# Patient Record
Sex: Male | Born: 1965 | Race: White | Hispanic: No | Marital: Married | Smoking: Never smoker
Health system: Southern US, Community
[De-identification: ages and names within clinical notes are randomized; demographics above are authoritative.]

## PROBLEM LIST (undated history)

## (undated) DIAGNOSIS — I10 Essential (primary) hypertension: Secondary | ICD-10-CM

## (undated) DIAGNOSIS — G709 Myoneural disorder, unspecified: Secondary | ICD-10-CM

## (undated) DIAGNOSIS — D751 Secondary polycythemia: Secondary | ICD-10-CM

## (undated) DIAGNOSIS — F32A Depression, unspecified: Secondary | ICD-10-CM

## (undated) DIAGNOSIS — F419 Anxiety disorder, unspecified: Secondary | ICD-10-CM

## (undated) DIAGNOSIS — R011 Cardiac murmur, unspecified: Secondary | ICD-10-CM

## (undated) DIAGNOSIS — J449 Chronic obstructive pulmonary disease, unspecified: Secondary | ICD-10-CM

## (undated) DIAGNOSIS — G4730 Sleep apnea, unspecified: Secondary | ICD-10-CM

## (undated) DIAGNOSIS — D759 Disease of blood and blood-forming organs, unspecified: Secondary | ICD-10-CM

## (undated) DIAGNOSIS — E669 Obesity, unspecified: Secondary | ICD-10-CM

## (undated) DIAGNOSIS — M1990 Unspecified osteoarthritis, unspecified site: Secondary | ICD-10-CM

## (undated) DIAGNOSIS — J45909 Unspecified asthma, uncomplicated: Secondary | ICD-10-CM

## (undated) DIAGNOSIS — R569 Unspecified convulsions: Secondary | ICD-10-CM

## (undated) HISTORY — DX: Essential (primary) hypertension: I10

## (undated) HISTORY — DX: Unspecified osteoarthritis, unspecified site: M19.90

## (undated) HISTORY — DX: Anxiety disorder, unspecified: F41.9

## (undated) HISTORY — DX: Cardiac murmur, unspecified: R01.1

## (undated) HISTORY — DX: Obesity, unspecified: E66.9

## (undated) HISTORY — DX: Chronic obstructive pulmonary disease, unspecified: J44.9

## (undated) HISTORY — PX: FRACTURE SURGERY: SHX138

## (undated) HISTORY — DX: Depression, unspecified: F32.A

## (undated) HISTORY — DX: Unspecified convulsions: R56.9

## (undated) HISTORY — DX: Unspecified asthma, uncomplicated: J45.909

---

## 1999-05-08 ENCOUNTER — Ambulatory Visit: Admission: RE | Admit: 1999-05-08 | Discharge: 1999-05-08 | Payer: Self-pay | Admitting: Otolaryngology

## 2002-04-10 ENCOUNTER — Encounter: Payer: Self-pay | Admitting: *Deleted

## 2002-04-10 ENCOUNTER — Emergency Department (HOSPITAL_COMMUNITY): Admission: EM | Admit: 2002-04-10 | Discharge: 2002-04-10 | Payer: Self-pay | Admitting: Emergency Medicine

## 2003-05-23 ENCOUNTER — Ambulatory Visit (HOSPITAL_COMMUNITY): Admission: RE | Admit: 2003-05-23 | Discharge: 2003-05-24 | Payer: Self-pay | Admitting: Otolaryngology

## 2004-10-20 ENCOUNTER — Emergency Department (HOSPITAL_COMMUNITY): Admission: EM | Admit: 2004-10-20 | Discharge: 2004-10-21 | Payer: Self-pay | Admitting: Emergency Medicine

## 2004-10-21 ENCOUNTER — Inpatient Hospital Stay (HOSPITAL_COMMUNITY): Admission: EM | Admit: 2004-10-21 | Discharge: 2004-10-23 | Payer: Self-pay | Admitting: Psychiatry

## 2004-10-21 ENCOUNTER — Ambulatory Visit: Payer: Self-pay | Admitting: Psychiatry

## 2007-09-18 ENCOUNTER — Emergency Department (HOSPITAL_COMMUNITY): Admission: EM | Admit: 2007-09-18 | Discharge: 2007-09-18 | Payer: Self-pay | Admitting: Family Medicine
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
## 2010-06-05 NOTE — Op Note (Signed)
NAME:  Brent Parks, Brent Parks                           ACCOUNT NO.:  [id number]   MEDICAL RECORD NO.:  [id number]                   PATIENT TYPE:  OIB   LOCATION:  3303                                 FACILITY:  [facility]   PHYSICIAN:  Suzanna Obey, M.D.                    DATE OF BIRTH:  Mar 17, 1965   DATE OF PROCEDURE:  05/23/2003  DATE OF DISCHARGE:                                 OPERATIVE REPORT   PREOPERATIVE DIAGNOSIS:  Deviated septum and turbinate hypertrophy.   POSTOPERATIVE DIAGNOSIS:  Deviated septum and turbinate hypertrophy.   OPERATION PERFORMED:  Septoplasty and submucous resection of inferior  turbinates.   SURGEON:  Suzanna Obey, M.D.   ANESTHESIA:  General endotracheal.   ESTIMATED BLOOD LOSS:  Less than 10 mL.   INDICATIONS FOR PROCEDURE:  This is a 45 year old who has had some chronic  nasal obstruction refractory to medical therapy.  He has obstructive sleep  apnea but just wants to acquire a better nasal airway to then treat the CPAP  later.  He was informed of the risks and benefits of the procedure including  bleeding, infection, perforation of the septum, change in the external  appearance of the nose, chronic crusting and drying, numbness of the teeth,  and risks of the anesthetic.  All questions were answered and consent was  obtained.   DESCRIPTION OF PROCEDURE:  The patient was taken to the operating room and  placed in supine position.  After adequate general endotracheal tube  anesthesia he was placed in supine position, prepped and draped in the usual  sterile manner.  Oxymetazoline pledgets were placed in the nose bilaterally  and then the septum and inferior turbinates were injected with 1% lidocaine  with 1:100,000 epinephrine.  A left hemitransfixion incision was performed  raising a mucoperichondrial and mucoperiosteal flap.  Cartilage was divided  about 1 cm posterior to the caudal strut and the cartilage was removed with  a Therapist, nutritional.  This was  deviated to the left.  The bone was then removed  with a Jansen-Middleton forceps also deviated to the left.  The inferior  spur was removed with a 4 mm osteotome.  This corrected the septal  deflection.  The turbinates were infractured.  A midline incision was made  with a 15 blade and mucosal flap elevated superiorly and inferior mucosa and  bone was removed with turbinate scissors.  Edge was cauterized with suction  cautery and the flap was laid back down over the raw surface and both  turbinates were outfractured.  The hemitransfixion incision was closed with  interrupted 4-0 chromic and a 4-0 plain gut quilting stitch placed through  the septum.  The nasopharynx was suctioned out of all blood and debris.  Telfa rolls soaked in bacitracin were placed into the nose bilaterally and  secured with 3-0 nylon.  The patient was awakened and brought to  the  recovery room in stable condition.  Counts correct.                                               Suzanna Obey, M.D.    Cordelia Pen  D:  05/23/2003  T:  05/23/2003  Job:  [id number]

## 2013-06-19 ENCOUNTER — Other Ambulatory Visit (HOSPITAL_COMMUNITY): Payer: Self-pay | Admitting: *Deleted

## 2013-06-20 ENCOUNTER — Ambulatory Visit (HOSPITAL_COMMUNITY)
Admission: RE | Admit: 2013-06-20 | Discharge: 2013-06-20 | Disposition: A | Discharge status: Home / Self Care | Payer: Medicare Other | Source: Ambulatory Visit | Attending: Family Medicine | Admitting: Family Medicine

## 2013-06-20 DIAGNOSIS — D582 Other hemoglobinopathies: Secondary | ICD-10-CM | POA: Insufficient documentation

## 2013-06-20 DIAGNOSIS — Z5181 Encounter for therapeutic drug level monitoring: Secondary | ICD-10-CM | POA: Diagnosis not present

## 2013-06-20 LAB — POCT HEMOGLOBIN-HEMACUE: Hemoglobin: 17.1 g/dL — ABNORMAL HIGH (ref 13.0–17.0)

## 2013-12-17 ENCOUNTER — Ambulatory Visit (INDEPENDENT_AMBULATORY_CARE_PROVIDER_SITE_OTHER): Payer: Medicare Other | Admitting: Infectious Disease

## 2013-12-17 ENCOUNTER — Ambulatory Visit (INDEPENDENT_AMBULATORY_CARE_PROVIDER_SITE_OTHER): Payer: Medicare Other | Admitting: Licensed Clinical Social Worker

## 2013-12-17 ENCOUNTER — Encounter: Payer: Self-pay | Admitting: Infectious Disease

## 2013-12-17 VITALS — BP 143/94 | HR 85 | Temp 98.4°F | Ht 68.0 in | Wt 209.2 lb

## 2013-12-17 DIAGNOSIS — B351 Tinea unguium: Secondary | ICD-10-CM

## 2013-12-17 DIAGNOSIS — E669 Obesity, unspecified: Secondary | ICD-10-CM | POA: Insufficient documentation

## 2013-12-17 DIAGNOSIS — F4329 Adjustment disorder with other symptoms: Secondary | ICD-10-CM

## 2013-12-17 DIAGNOSIS — Z23 Encounter for immunization: Secondary | ICD-10-CM

## 2013-12-17 DIAGNOSIS — B571 Acute Chagas' disease without heart involvement: Secondary | ICD-10-CM

## 2013-12-17 MED ORDER — TERBINAFINE HCL 250 MG PO TABS: 250.0000 mg | ORAL_TABLET | Freq: Every day | ORAL | Status: DC

## 2013-12-17 NOTE — Patient Instructions (Signed)
Your blood work for T. Cruzi (Chagas) from [facility] was a False Positive test , we do not recommend further testing based on your risk factors, labs, and your entire story. I have reviewed this with my colleague Dr. Luciana Axeomer

## 2013-12-17 NOTE — Progress Notes (Signed)
   Subjective:    Patient ID: Brent AbrahamsRandy S Parks, male    DOB: April 25, 1965, 48 y.o.   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 He brought in deceased insects which he believes that are "Reduvid bugs."  He never had symptoms consistent with acute infection of Chagas Romaa's sign or Chagoma. He is NOT from and endemic area for Chagas diseas having never travelled outside "of his zip code."  I reviewed case with my partner Dr. Luciana Axeomer and we agreed that the [facility] result was a FALSE POSITIVE and no need for further testing.      Review of Systems  Constitutional: Negative for fever, chills, diaphoresis, activity change, appetite change, fatigue and unexpected weight change.  HENT: Negative for congestion, rhinorrhea, sinus pressure, sneezing, sore throat and trouble swallowing.   Eyes: Negative for photophobia and visual disturbance.  Respiratory: Negative for cough, chest tightness, shortness of breath, wheezing and stridor.   Cardiovascular: Negative for chest pain, palpitations and leg swelling.  Gastrointestinal: Negative for nausea, vomiting, abdominal pain, diarrhea, constipation, blood in stool, abdominal distention and anal bleeding.  Genitourinary: Negative for dysuria, hematuria, flank pain and difficulty urinating.  Musculoskeletal: Negative for myalgias, back pain, joint swelling, arthralgias and gait problem.  Skin: Negative for color change, pallor, rash and wound.  Neurological: Negative for dizziness, tremors, weakness and light-headedness.  Hematological: Positive for adenopathy. Does not bruise/bleed easily.    Psychiatric/Behavioral: Positive for dysphoric mood. Negative for behavioral problems, confusion, sleep disturbance, decreased concentration and agitation.       Objective:   Physical Exam  Constitutional: He is oriented to person, place, and time. He appears well-developed and well-nourished. No distress.  HENT:  Head: Normocephalic and atraumatic.  Mouth/Throat: Oropharynx is clear and moist. No oropharyngeal exudate.  Eyes: Conjunctivae and EOM are normal. No scleral icterus.  Neck: Normal range of motion. Neck supple. No JVD present.  Cardiovascular: Normal rate and regular rhythm.   Pulmonary/Chest: Effort normal. No respiratory distress. He has no wheezes.  Abdominal: Soft. He exhibits no distension.  Musculoskeletal: He exhibits no edema or tenderness.  Lymphadenopathy:    He has no cervical adenopathy.  Neurological: He is alert and oriented to person, place, and time. He exhibits normal muscle tone. Coordination normal.  Skin: Skin is warm and dry. He is not diaphoretic. No erythema. No pallor.  Psychiatric: He has a normal mood and affect. His behavior is normal. Judgment and thought content normal.  Nursing note and vitals reviewed.  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 Luciana Axeomer) and he agreed this test from the [facility] is a false positive and no need for further testing. I spent greater than 45 minutes with the patient including greater than 50% of time in face to face counsel of the patient and in coordination of their care.   Onychomycosis: will write for 3 month supply of lamisil  systemic

## 2016-11-02 ENCOUNTER — Encounter: Payer: Self-pay | Admitting: Internal Medicine

## 2016-11-03 ENCOUNTER — Encounter: Payer: Self-pay | Admitting: Nurse Practitioner

## 2016-11-04 NOTE — H&P (Signed)
PREOPERATIVE H&P  Chief Complaint: Spontaneous rupture of extensor tendons, right lower leg, Pathological dislocation of right knee, not elsewhere classified [id number],  M24.361  HPI: Brent AbrahamsRandy S Parks is a 51 y.o. male who presents for preoperative history and physical with a diagnosis of Spontaneous rupture of extensor tendons, right lower leg, Pathological dislocation of right knee, not elsewhere classified M66.2361,  M24.361. Symptoms are rated as moderate to severe, and have been worsening.  This is significantly impairing activities of daily living.  He has elected for surgical management.   Past Medical History:  Diagnosis Date  . Hypertension   . Obesity    No past surgical history on file. Social History   Social History  . Marital status: Married    Spouse name: Parks/A  . Number of children: Parks/A  . Years of education: Parks/A   Social History Main Topics  . Smoking status: Never Smoker  . Smokeless tobacco: Not on file  . Alcohol use 3.6 oz/week    6 Cans of beer per week  . Drug use: No  . Sexual activity: Not on file   Other Topics Concern  . Not on file   Social History Narrative  . No narrative on file   No family history on file. No Known Allergies Prior to Admission medications   Medication Sig Start Date End Date Taking? Authorizing Provider  amphetamine-dextroamphetamine (ADDERALL XR) 20 MG 24 hr capsule Take 20 mg by mouth 2 (two) times daily.    [provider]  carbamazepine (TEGRETOL) 200 MG tablet Take 800 mg by mouth at bedtime.    [provider]  escitalopram (LEXAPRO) 20 MG tablet Take 20 mg by mouth 2 (two) times daily.    [provider]  lamoTRIgine (LAMICTAL) 200 MG tablet Take 200 mg by mouth 2 (two) times daily.    [provider]  lisinopril-hydrochlorothiazide (PRINZIDE,ZESTORETIC) 20-25 MG per tablet Take 1 tablet by mouth daily.    [provider]  terbinafine (LAMISIL) 250 MG tablet Take 1 tablet (250  mg total) by mouth daily. 12/17/13   Brent Parks, Brent N, MD     Positive ROS: All other systems have been reviewed and were otherwise negative with the exception of those mentioned in the HPI and as above.  Physical Exam: General: Alert, no acute distress Cardiovascular: No pedal edema Respiratory: No cyanosis, no use of accessory musculature GI: No organomegaly, abdomen is soft and non-tender Skin: No lesions in the area of chief complaint Neurologic: Sensation intact distally Psychiatric: Patient is competent for consent with normal mood and affect Lymphatic: No axillary or cervical lymphadenopathy  MUSCULOSKELETAL: R knee - weak extension, obvious asymmetry in patellar tendons, WWP foot, intact distal neuro status.  Assessment: Spontaneous rupture of extensor tendons, right lower leg, Pathological dislocation of right knee, not elsewhere classified [id number],  M24.361  Plan: Plan for Procedure(s): RIGHT KNEE PATELLA TENDON RECONSTRUCTION QUAD RELEASE TENDON TRANSFER ACHILLES ALLOGRAFT AVAILABLE  The risks benefits and alternatives were discussed with the patient including but not limited to the risks of nonoperative treatment, versus surgical intervention including infection, bleeding, nerve injury,  blood clots, cardiopulmonary complications, morbidity, mortality, among others, and they were willing to proceed.   Brent Pippinax T Rossie Bretado, MD  11/04/2016 2:12 PM

## 2016-11-08 ENCOUNTER — Telehealth: Payer: Self-pay | Admitting: Nurse Practitioner

## 2016-11-08 ENCOUNTER — Ambulatory Visit: Payer: Medicare Other | Admitting: Nurse Practitioner

## 2016-11-10 ENCOUNTER — Encounter (HOSPITAL_BASED_OUTPATIENT_CLINIC_OR_DEPARTMENT_OTHER): Payer: Self-pay | Admitting: *Deleted

## 2016-11-18 ENCOUNTER — Encounter: Payer: Medicare Other | Admitting: Internal Medicine

## 2017-01-05 ENCOUNTER — Encounter (HOSPITAL_BASED_OUTPATIENT_CLINIC_OR_DEPARTMENT_OTHER): Admission: RE | Payer: Self-pay | Source: Ambulatory Visit

## 2017-01-05 ENCOUNTER — Ambulatory Visit (HOSPITAL_BASED_OUTPATIENT_CLINIC_OR_DEPARTMENT_OTHER): Admission: RE | Admit: 2017-01-05 | Payer: Medicare Other | Source: Ambulatory Visit | Admitting: Orthopaedic Surgery

## 2017-01-05 HISTORY — DX: Disease of blood and blood-forming organs, unspecified: D75.9

## 2017-01-05 HISTORY — DX: Myoneural disorder, unspecified: G70.9

## 2017-01-05 HISTORY — DX: Sleep apnea, unspecified: G47.30

## 2017-01-05 HISTORY — DX: Secondary polycythemia: D75.1

## 2017-01-05 SURGERY — REPAIR, TENDON, PATELLAR
Anesthesia: Regional | Site: Knee | Laterality: Right

## 2019-11-19 HISTORY — PX: TENDON MANIPULATION: SHX394

## 2019-12-19 ENCOUNTER — Encounter (HOSPITAL_COMMUNITY): Payer: Self-pay | Admitting: *Deleted

## 2019-12-19 ENCOUNTER — Other Ambulatory Visit (HOSPITAL_COMMUNITY): Payer: Self-pay | Admitting: Emergency Medicine

## 2019-12-19 ENCOUNTER — Emergency Department (HOSPITAL_COMMUNITY)
Admission: EM | Admit: 2019-12-19 | Discharge: 2019-12-19 | Disposition: A | Discharge status: Home / Self Care | Payer: Medicare Other | Attending: Emergency Medicine | Admitting: Emergency Medicine

## 2019-12-19 ENCOUNTER — Other Ambulatory Visit: Payer: Self-pay

## 2019-12-19 ENCOUNTER — Emergency Department (HOSPITAL_COMMUNITY): Payer: Medicare Other

## 2019-12-19 DIAGNOSIS — J40 Bronchitis, not specified as acute or chronic: Secondary | ICD-10-CM | POA: Diagnosis not present

## 2019-12-19 DIAGNOSIS — R590 Localized enlarged lymph nodes: Secondary | ICD-10-CM | POA: Insufficient documentation

## 2019-12-19 DIAGNOSIS — I10 Essential (primary) hypertension: Secondary | ICD-10-CM | POA: Insufficient documentation

## 2019-12-19 DIAGNOSIS — Z7982 Long term (current) use of aspirin: Secondary | ICD-10-CM | POA: Diagnosis not present

## 2019-12-19 DIAGNOSIS — R0789 Other chest pain: Secondary | ICD-10-CM | POA: Insufficient documentation

## 2019-12-19 DIAGNOSIS — R059 Cough, unspecified: Secondary | ICD-10-CM | POA: Diagnosis present

## 2019-12-19 LAB — RESPIRATORY PANEL BY PCR

## 2019-12-19 LAB — CBC WITH DIFFERENTIAL/PLATELET
Abs Immature Granulocytes: 0.03 10*3/uL (ref 0.00–0.07)
Basophils Absolute: 0 10*3/uL (ref 0.0–0.1)
Basophils Relative: 0 %
Eosinophils Absolute: 0.3 10*3/uL (ref 0.0–0.5)
Eosinophils Relative: 3 %
HCT: 45 % (ref 39.0–52.0)
Hemoglobin: 15.6 g/dL (ref 13.0–17.0)
Immature Granulocytes: 0 %
Lymphocytes Relative: 25 %
Lymphs Abs: 2 10*3/uL (ref 0.7–4.0)
MCH: 31.7 pg (ref 26.0–34.0)
MCHC: 34.7 g/dL (ref 30.0–36.0)
MCV: 91.5 fL (ref 80.0–100.0)
Monocytes Absolute: 0.8 10*3/uL (ref 0.1–1.0)
Monocytes Relative: 10 %
Neutro Abs: 4.9 10*3/uL (ref 1.7–7.7)
Neutrophils Relative %: 62 %
Platelets: 287 10*3/uL (ref 150–400)
RBC: 4.92 MIL/uL (ref 4.22–5.81)
RDW: 13.7 % (ref 11.5–15.5)
WBC: 8 10*3/uL (ref 4.0–10.5)
nRBC: 0 % (ref 0.0–0.2)

## 2019-12-19 LAB — COMPREHENSIVE METABOLIC PANEL
ALT: 29 U/L (ref 0–44)
AST: 30 U/L (ref 15–41)
Albumin: 3.9 g/dL (ref 3.5–5.0)
Alkaline Phosphatase: 84 U/L (ref 38–126)
Anion gap: 11 (ref 5–15)
BUN: 11 mg/dL (ref 6–20)
CO2: 25 mmol/L (ref 22–32)
Calcium: 9 mg/dL (ref 8.9–10.3)
Chloride: 102 mmol/L (ref 98–111)
Creatinine, Ser: 1.11 mg/dL (ref 0.61–1.24)
GFR, Estimated: >60 mL/min (ref >60)
Glucose, Bld: 139 mg/dL — ABNORMAL HIGH (ref 70–99)
Potassium: 3.9 mmol/L (ref 3.5–5.1)
Sodium: 138 mmol/L (ref 135–145)
Total Bilirubin: 0.5 mg/dL (ref 0.3–1.2)
Total Protein: 7.5 g/dL (ref 6.5–8.1)

## 2019-12-19 MED ORDER — AEROCHAMBER Z-STAT PLUS/MEDIUM MISC: 1.0000 | Freq: Once | Status: DC

## 2019-12-19 MED ORDER — HYDROCODONE-HOMATROPINE 5-1.5 MG/5ML PO SYRP: 5.0000 mL | ORAL_SOLUTION | Freq: Four times a day (QID) | ORAL | 0 refills | Status: DC | PRN

## 2019-12-19 MED ORDER — ALBUTEROL SULFATE HFA 108 (90 BASE) MCG/ACT IN AERS
2.0000 | INHALATION_SPRAY | Freq: Once | RESPIRATORY_TRACT | Status: AC
  Administered 2019-12-19: 2 via RESPIRATORY_TRACT
  Filled 2019-12-19: qty 6.7

## 2019-12-19 MED ORDER — AEROCHAMBER PLUS FLO-VU MEDIUM MISC: 1.0000 | Freq: Once | Status: DC

## 2019-12-19 MED FILL — HYDROCODONE-HOMATROPINE SYR: 5-1.5 | 6 days supply | Qty: 120 | Fill #0

## 2019-12-19 NOTE — ED Provider Notes (Signed)
[facility]-EMERGENCY DEPT Provider Note   CSN: [id number] Arrival date & time: 12/19/19  0865     History Chief Complaint  Patient presents with  . Cough    Brent Parks is a 54 y.o. male w PMHx HTN, presenting with complaint of cough that began 3 days ago.  Patient states this has occurred multiple times in the past and never had definitive diagnosis.  He reports a severe coughing fits.  His cough is productive though he is not visualize the sputum.  He has soreness in his chest wall from the frequent coughing.  Coughing and shortness of breath or worse with laying flat, better with sitting upright.  He is not short of breath at rest, only associated with the coughing.  Denies any postnasal drip, nasal congestion, sore throat, body aches or other infectious symptoms, new LE edema.  Reports T-max of 14 F.  He was seen at urgent care yesterday who swabbed him for Covid which was negative.  He was prescribed prednisone and some other medication which he is currently unsure of.  He took a dose of the prednisone last night.  He states the urgent care provider thought his symptoms were attributed to allergies however PCP has referred him to [facility] pulmonology for further evaluation.  He is awaiting this appointment to be set up.  States this has occurred about 3 times in the past, this year occurred in January, July, and now.  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  Of note, he had patella tendon repair 3 weeks ago to the right knee, no complications from this.  The history is provided by the patient.       Past Medical History:  Diagnosis Date  . Blood dyscrasia    erythrocytosis  . Erythrocytosis   . Hypertension   . Neuromuscular disorder (HCC)   . Obesity   . Sleep apnea     Patient Active Problem List   Diagnosis Date Noted  . Obesity      Past Surgical History:  Procedure Laterality Date  . TENDON MANIPULATION Right 11/2019       No family history on file.  Social History   Tobacco Use  . Smoking status: Never Smoker  . Smokeless tobacco: Never Used  Vaping Use  . Vaping Use: Never assessed  Substance Use Topics  . Alcohol use: Not Currently  . Drug use: No    Home Medications Prior to Admission medications   Medication Sig Start Date End Date Taking? Authorizing Provider  amphetamine-dextroamphetamine (ADDERALL XR) 20 MG 24 hr capsule Take 20 mg by mouth daily.    Yes [provider]  aspirin 81 MG EC tablet Take 81 mg by mouth daily.   Yes [provider]  benzonatate (TESSALON) 100 MG capsule Take 100 mg by mouth 3 (three) times daily as needed for cough. 12/18/19  Yes [provider]  carbamazepine (TEGRETOL) 200 MG tablet Take 800 mg by mouth at bedtime.   Yes [provider]  escitalopram (LEXAPRO) 20 MG tablet Take 20 mg by mouth 2 (two) times daily.   Yes [provider]  lamoTRIgine (LAMICTAL) 200 MG tablet Take 400 mg by mouth daily.    Yes [provider]  Multiple Vitamin (MULTIVITAMIN WITH MINERALS) TABS tablet Take 1 tablet by mouth daily.   Yes [provider]  Omega-3 Fatty Acids (FISH  OIL PO) Take 1 tablet by mouth daily.   Yes [provider]  oxymetazoline (AFRIN) 0.05 % nasal spray Place 1 spray into both nostrils 2 (two) times daily as needed for congestion.   Yes [provider]  Phenyleph-Doxylamine-DM-APAP (NYQUIL SEVERE COLD/FLU) 5-6.25-10-325 MG/15ML LIQD Take 15 mLs by mouth as needed (congestion).   Yes [provider]  polyvinyl alcohol (LIQUIFILM TEARS) 1.4 % ophthalmic solution Place 1 drop into both eyes as needed for dry eyes.   Yes [provider]  predniSONE (DELTASONE) 20 MG tablet Take 40 mg by mouth daily. 5 day supply 12/18/19  Yes [provider]  traZODone (DESYREL)  50 MG tablet Take 50-150 mg by mouth at bedtime. 08/20/19  Yes [provider]  HYDROcodone-homatropine (HYCODAN) 5-1.5 MG/5ML syrup Take 5 mLs by mouth every 6 (six) hours as needed for cough. 12/19/19   Kinsley Holderman, [place] N, PA-C    Allergies    Patient has no known allergies.  Review of Systems   Review of Systems  Respiratory: Positive for cough and shortness of breath.   All other systems reviewed and are negative.   Physical Exam Updated Vital Signs BP (!) 149/101   Pulse 84   Temp 98 F (36.7 C) (Oral)   Resp 20   Ht 5\' 6"  (1.676 m)   Wt 92.5 kg   SpO2 95%   BMI 32.93 kg/m   Physical Exam Vitals and nursing note reviewed.  Constitutional:      General: He is not in acute distress.    Appearance: He is well-developed. He is not ill-appearing.  HENT:     Head: Normocephalic and atraumatic.     Mouth/Throat:     Mouth: Mucous membranes are moist.     Pharynx: Oropharynx is clear.  Eyes:     Conjunctiva/sclera: Conjunctivae normal.  Cardiovascular:     Rate and Rhythm: Normal rate and regular rhythm.  Pulmonary:     Effort: Pulmonary effort is normal. No respiratory distress.     Breath sounds: Normal breath sounds.  Abdominal:     General: Bowel sounds are normal.     Palpations: Abdomen is soft.     Tenderness: There is no abdominal tenderness.  Musculoskeletal:     Cervical back: Normal range of motion and neck supple.  Lymphadenopathy:     Cervical: Cervical adenopathy present.  Skin:    General: Skin is warm.  Neurological:     Mental Status: He is alert.  Psychiatric:        Behavior: Behavior normal.     ED Results / Procedures / Treatments   Labs (all labs ordered are listed, but only abnormal results are displayed) Labs Reviewed  COMPREHENSIVE METABOLIC PANEL - Abnormal; Notable for the following components:      Result Value   Glucose, Bld 139 (*)    All other components within normal limits  CBC WITH DIFFERENTIAL/PLATELET    EKG  None  Radiology DG Chest 2 View  Result Date: 12/19/2019 CLINICAL DATA:  Chronic recurring cough. EXAM: CHEST - 2 VIEW COMPARISON:  None. FINDINGS: The heart size and mediastinal contours are within normal limits. No focal consolidation. No visible pneumothorax. No pleural effusions. Thoracic spondylosis. Degenerative change bilateral AC joints. IMPRESSION: No active cardiopulmonary disease. Electronically Signed   By: 14/01/2019 MD   On: 12/19/2019 08:28    Procedures Procedures (including critical care time)  Medications Ordered in ED Medications  aerochamber Z-Stat Plus/medium 1 each (  1 each Other Refused 12/19/19 0920)  albuterol (VENTOLIN HFA) 108 (90 Base) MCG/ACT inhaler 2 puff (2 puffs Inhalation Given 12/19/19 0919)    ED Course  I have reviewed the triage vital signs and the nursing notes.  Pertinent labs & imaging results that were available during my care of the patient were reviewed by me and considered in my medical decision making (see chart for details).    MDM Rules/Calculators/A&P                          Patient presenting for cough x3 days.  States he has had this recurring cough multiple times per year and is unsure of the cause.  Temperature as high as 99 F at home, afebrile here as well.  He is only short of breath during active coughing fits and is having chest wall pain associated with the coughing.  No other ENT symptoms.  He is prescribed prednisone burst by urgent care yesterday as well as Tessalon Perles, with negative Covid swab.  No imaging was done.  PCP has referred him to pulmonology for further evaluation of this recurring cough, however was requesting some additional work-up today during his active symptoms.  On exam, he has some coughing fits with cough that sounds dry.  He is otherwise speaking full sentences with normal respiratory effort.  Lungs are clear bilaterally.  Chest x-ray is negative for infiltrate or other abnormal pulmonary findings.   Labs with normal white count and differential, no significant metabolic abnormalities.  Patient is given dose of albuterol to treat possible reactive airway.  Also ordered respiratory panel to evaluate for other viral causes.  Nighttime antitussive also prescribed due to difficulty managing cough with NyQuil.  He is instructed of strict return precautions.  Otherwise well-appearing and is in no acute distress, appropriate for discharge.  Discussed results, findings, treatment and follow up. Patient advised of return precautions. Patient verbalized understanding and agreed with plan.  Final Clinical Impression(s) / ED Diagnoses Final diagnoses:  Bronchitis    Rx / DC Orders ED Discharge Orders         Ordered    HYDROcodone-homatropine (HYCODAN) 5-1.5 MG/5ML syrup  Every 6 hours PRN        12/19/19 0913           Nohemi Nicklaus, [place] N, PA-C 12/19/19 1012    Lorre Nick, MD 12/24/19 1338

## 2019-12-19 NOTE — ED Triage Notes (Signed)
Pt reports cough x 3 days.  Pt reports that the coughing is so severe that it causes pain in chest.  Pt also reports a mild fever. Pt reports only taking Nyquil for symptoms.  Pt had a negative covid test yesterday and has been vaccinated with two shots.  Pt a/o x 4 and ambulating with crutches. Pt has been in contact with his PCP and is suppose to be set up with a pulmonologist from [facility].

## 2019-12-19 NOTE — Discharge Instructions (Signed)
You can take the Tessalon as directed as needed for cough during the day.  At nighttime you can take the cough syrup as directed. Do not take with the nyquil.  You can use 1 to 2 puffs of the inhaler every 6 hours as needed. Follow close with your primary care provider, and follow with pulmonology for your recurring cough. Return to the emergency department for persistent shortness of breath, or other concerning symptoms.

## 2020-07-24 ENCOUNTER — Other Ambulatory Visit (HOSPITAL_COMMUNITY): Payer: Self-pay

## 2020-07-24 ENCOUNTER — Emergency Department (HOSPITAL_COMMUNITY)
Admission: EM | Admit: 2020-07-24 | Discharge: 2020-07-24 | Disposition: A | Discharge status: Home / Self Care | Payer: Medicare Other | Attending: Emergency Medicine | Admitting: Emergency Medicine

## 2020-07-24 ENCOUNTER — Emergency Department (HOSPITAL_COMMUNITY): Payer: Medicare Other

## 2020-07-24 ENCOUNTER — Other Ambulatory Visit: Payer: Self-pay

## 2020-07-24 ENCOUNTER — Encounter (HOSPITAL_COMMUNITY): Payer: Self-pay

## 2020-07-24 DIAGNOSIS — W07XXXA Fall from chair, initial encounter: Secondary | ICD-10-CM | POA: Insufficient documentation

## 2020-07-24 DIAGNOSIS — Z7982 Long term (current) use of aspirin: Secondary | ICD-10-CM | POA: Insufficient documentation

## 2020-07-24 DIAGNOSIS — Y92002 Bathroom of unspecified non-institutional (private) residence single-family (private) house as the place of occurrence of the external cause: Secondary | ICD-10-CM | POA: Insufficient documentation

## 2020-07-24 DIAGNOSIS — S2231XA Fracture of one rib, right side, initial encounter for closed fracture: Secondary | ICD-10-CM | POA: Insufficient documentation

## 2020-07-24 DIAGNOSIS — Z79899 Other long term (current) drug therapy: Secondary | ICD-10-CM | POA: Diagnosis not present

## 2020-07-24 DIAGNOSIS — S299XXA Unspecified injury of thorax, initial encounter: Secondary | ICD-10-CM | POA: Diagnosis present

## 2020-07-24 DIAGNOSIS — W19XXXA Unspecified fall, initial encounter: Secondary | ICD-10-CM

## 2020-07-24 DIAGNOSIS — I10 Essential (primary) hypertension: Secondary | ICD-10-CM | POA: Diagnosis not present

## 2020-07-24 MED ORDER — OXYCODONE-ACETAMINOPHEN 5-325 MG PO TABS
1.0000 | ORAL_TABLET | Freq: Four times a day (QID) | ORAL | 0 refills | Status: DC | PRN
  Filled 2020-07-24: qty 20, 5d supply, fill #0

## 2020-07-24 MED ORDER — OXYCODONE-ACETAMINOPHEN 5-325 MG PO TABS
1.0000 | ORAL_TABLET | Freq: Once | ORAL | Status: AC
  Administered 2020-07-24: 1 via ORAL
  Filled 2020-07-24: qty 1

## 2020-07-24 NOTE — ED Notes (Signed)
Patient given incentive spirometer. Patient educated on use of IS.

## 2020-07-24 NOTE — ED Provider Notes (Signed)
Care assumed from [facility] at shift change, please see her note for full details, but in brief Brent Parks is a 55 y.o. male who presents after a fallLast night with sharp pain to the right rib cage.  Found to have 1/10 rib fracture, but lucency noted on humerus from rib films, follow-up dedicated x-ray recommended.  Plan: F/u humerus XR, anticipate discharge  BP 129/87 (BP Location: Left Arm)   Pulse 83   Temp 98.2 F (36.8 C) (Oral)   Resp 18   Ht $R'5\' 6"'ON$  (1.676 m)   Wt 86.2 kg   SpO2 95%   BMI 30.67 kg/m   ED Course/Procedures  Labs Reviewed - No data to display  Radiology DG Ribs Unilateral W/Chest Right  Result Date: 07/24/2020 CLINICAL DATA:  Right rib pain status post fall. EXAM: RIGHT RIBS AND CHEST - 3+ VIEW COMPARISON:  Chest x-ray 12/19/2019. FINDINGS: Slightly displaced right posterior tenth rib fractures noted. No pneumothorax. Incidental note is made of a lucency noted the proximal right humerus. Right humerus series suggested for further evaluation. Degenerative change thoracic spine. IMPRESSION: 1. Slightly displaced right posterior tenth rib fracture. No pneumothorax. 2. Incidental note is made of a of focal lucency in the proximal right humerus. Right humerus series suggested for further evaluation. Electronically Signed   By: Marcello Moores  Register   On: 07/24/2020 13:46   DG Humerus Right  Result Date: 07/24/2020 CLINICAL DATA:  Right humerus lesion seen on chest/rib radiograph. EXAM: RIGHT HUMERUS - 2+ VIEW COMPARISON:  Chest/right rib radiographs 07/24/2020. Right shoulder radiograph 07/19/2016. Right shoulder MRI 07/26/2016. FINDINGS: As seen on today's earlier rib series, there is a 1 cm well-defined lucent lesion in the proximal shaft of the right humerus with thin sclerotic margin. This is new from the 2018 radiographs and MRI. No other bone lesion is identified. There is no humerus fracture. The ribs were better evaluated on today's earlier dedicated radiographs.  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 The soft tissues are unremarkable. IMPRESSION: 1 cm lucent lesion in the proximal right humerus, new from 2018 and nonspecific. Consider evaluation for multiple myeloma. Electronically Signed   By: Logan Bores M.D.   On: 07/24/2020 15:23     Procedures  MDM   Humerus films with 1 cm lucent lesion in the proximal humerus new from 2018, could be from previous biceps injury, should follow-up with PCP, may need further evaluation for potential multiple myeloma.  Patient provided follow-up instructions and pain control.  Discharged home in good condition.       Jacqlyn Larsen, PA-C 07/24/20 1537    Isla Pence, MD 07/24/20 [phone]

## 2020-07-24 NOTE — ED Triage Notes (Signed)
Patient states he was standing in a chair in the bathroom and when he was stepping down he kicked the chair out and fell, landing on the bath tub with his right rib cage area yesterday. Patient denies hitting his head or having LOC.  Patient c/o right rib cage area pain.

## 2020-07-24 NOTE — Discharge Instructions (Addendum)
Your xray showed a fracture of your 10th rib. Please pick up pain medication and take as needed for pain. I would also recommend icing the area to help with pain and OTC Voltaren gel to the area.   The x-ray of your humerus showed a 1 cm lucent lesion, this may be from where you previously tore your bicep tendon, please follow-up with your primary care doctor for continued monitoring of this bony lesion.  Use the incentive spirometer as indicated.   Please follow up with your PCP regarding your ED visit today.   Return to the ED for any new/worsening symptoms including worsening shortness of breath, cough, fevers > 100.4, or any other new/concerning symptoms

## 2020-07-24 NOTE — ED Provider Notes (Signed)
[facility]-EMERGENCY DEPT Provider Note   CSN: [id number] Arrival date & time: 07/24/20  1224     History Chief Complaint  Patient presents with   Fall   rib cage pain    Brent Parks is a 55 y.o. male with Pmhx HTN who presents to the ED today with complaint of sudden onset, constant, sharp, right rib pain s/p fall that occurred last night.  Patient states that he was standing on a chair in the bathroom yesterday doing some work.  He states that he came down onto his left leg.  He states that he just had right knee surgery for patellar tendon rupture in February and states that he is limited range of motion to the right knee.  On his way down with his left leg his right knee locked up while on the chair causing him to fall.  Patient states that he attempted to grab onto the shower curtain however it fell causing him to fall directly onto the edge of the bathtub with his right side/rib area.  No head injury or loss of consciousness.  He states he has been having severe pain to this area since then however has not taken anything for pain.  He also complains of shortness of breath due to the pain.  He states he has broken ribs in the past however this feels much more severe.  No other complaints at this time.  Patient is not anticoagulated.  The history is provided by the patient and medical records.      Past Medical History:  Diagnosis Date   Blood dyscrasia    erythrocytosis   Erythrocytosis    Hypertension    Neuromuscular disorder (HCC)    Obesity    Sleep apnea     Patient Active Problem List   Diagnosis Date Noted   Obesity     Past Surgical History:  Procedure Laterality Date   TENDON MANIPULATION Right 11/2019       Family History  Problem Relation Age of Onset   COPD Mother     Social History   Tobacco Use   Smoking status: Never   Smokeless tobacco: Never  Vaping Use   Vaping Use: Never used  Substance Use Topics   Alcohol use:  Not Currently   Drug use: No    Home Medications Prior to Admission medications   Medication Sig Start Date End Date Taking? Authorizing Provider  oxyCODONE-acetaminophen (PERCOCET/ROXICET) 5-325 MG tablet Take 1 tablet by mouth every 6 (six) hours as needed for severe pain. 07/24/20  Yes Jeannifer Drakeford, PA-C  amphetamine-dextroamphetamine (ADDERALL XR) 20 MG 24 hr capsule Take 20 mg by mouth daily.     [provider]  aspirin 81 MG EC tablet Take 81 mg by mouth daily.    [provider]  benzonatate (TESSALON) 100 MG capsule Take 100 mg by mouth 3 (three) times daily as needed for cough. 12/18/19   [provider]  carbamazepine (TEGRETOL) 200 MG tablet Take 800 mg by mouth at bedtime.    [provider]  escitalopram (LEXAPRO) 20 MG tablet Take 20 mg by mouth 2 (two) times daily.    [provider]  lamoTRIgine (LAMICTAL) 200 MG tablet Take 400 mg by mouth daily.     [provider]  Multiple Vitamin (MULTIVITAMIN WITH MINERALS) TABS tablet Take 1 tablet by mouth daily.    [provider]  Omega-3 Fatty Acids (FISH OIL PO) Take 1 tablet  by mouth daily.    [provider]  oxymetazoline (AFRIN) 0.05 % nasal spray Place 1 spray into both nostrils 2 (two) times daily as needed for congestion.    [provider]  Phenyleph-Doxylamine-DM-APAP (NYQUIL SEVERE COLD/FLU) 5-6.25-10-325 MG/15ML LIQD Take 15 mLs by mouth as needed (congestion).    [provider]  polyvinyl alcohol (LIQUIFILM TEARS) 1.4 % ophthalmic solution Place 1 drop into both eyes as needed for dry eyes.    [provider]  predniSONE (DELTASONE) 20 MG tablet Take 40 mg by mouth daily. 5 day supply 12/18/19   [provider]  traZODone (DESYREL) 50 MG tablet Take 50-150 mg by mouth at bedtime. 08/20/19   [provider]    Allergies    Patient has no known allergies.  Review of Systems   Review of Systems   Constitutional:  Negative for chills and fever.  Respiratory:  Positive for shortness of breath.   Cardiovascular:  Positive for chest pain (right rib pain).  All other systems reviewed and are negative.  Physical Exam Updated Vital Signs BP 129/87 (BP Location: Left Arm)   Pulse 83   Temp 98.2 F (36.8 C) (Oral)   Resp 18   Ht 5\' 6"  (1.676 m)   Wt 86.2 kg   SpO2 95%   BMI 30.67 kg/m   Physical Exam Vitals and nursing note reviewed.  Constitutional:      Appearance: He is not ill-appearing.  HENT:     Head: Normocephalic and atraumatic.  Eyes:     Conjunctiva/sclera: Conjunctivae normal.  Cardiovascular:     Rate and Rhythm: Normal rate and regular rhythm.     Pulses: Normal pulses.  Pulmonary:     Effort: Pulmonary effort is normal.     Breath sounds: Normal breath sounds. No wheezing, rhonchi or rales.     Comments: No ecchymosis noted to right chest wall/back. + TTP to posterior lateral mid/lower ribs.  Pt speaking in short sentences due to pain. Shallow breathing. Satting 95% on RA. LCTAB.  Chest:     Chest wall: Tenderness present.  Abdominal:     Tenderness: There is no abdominal tenderness. There is no guarding or rebound.  Skin:    General: Skin is warm and dry.     Coloration: Skin is not jaundiced.  Neurological:     Mental Status: He is alert.    ED Results / Procedures / Treatments   Labs (all labs ordered are listed, but only abnormal results are displayed) Labs Reviewed - No data to display  EKG None  Radiology DG Ribs Unilateral W/Chest Right  Result Date: 07/24/2020 CLINICAL DATA:  Right rib pain status post fall. EXAM: RIGHT RIBS AND CHEST - 3+ VIEW COMPARISON:  Chest x-ray 12/19/2019. FINDINGS: Slightly displaced right posterior tenth rib fractures noted. No pneumothorax. Incidental note is made of a lucency noted the proximal right humerus. Right humerus series suggested for further evaluation. Degenerative change thoracic spine. IMPRESSION:  1. Slightly displaced right posterior tenth rib fracture. No pneumothorax. 2. Incidental note is made of a of focal lucency in the proximal right humerus. Right humerus series suggested for further evaluation. Electronically Signed   By: 14/01/2019  Register   On: 07/24/2020 13:46    Procedures Procedures   Medications Ordered in ED Medications  oxyCODONE-acetaminophen (PERCOCET/ROXICET) 5-325 MG per tablet 1 tablet (1 tablet Oral Given 07/24/20 1310)    ED Course  I have reviewed the triage vital signs and the nursing  notes.  Pertinent labs & imaging results that were available during my care of the patient were reviewed by me and considered in my medical decision making (see chart for details).    MDM Rules/Calculators/A&P                          55 year old male presents to the ED today after falling off of a chair and landing on his right ribs against the edge of the bathtub last night.  Has been having pain to this area with shortness of breath since.  No head injury or loss of consciousness.  On arrival to the ED vitals are stable.  Patient appears mildly uncomfortable on exam.  He is speaking in shorter sentences due to pain and shallow breaths.  He does have equal breath sounds throughout with equal chest rise.  There is no crepitus or ecchymosis noted to his chest wall.  He does have tenderness palpation to the lateral/posterior aspect of his ribs.  We will plan for x-ray and pain medication at this time with further evaluation.  Chest xray: IMPRESSION:  1. Slightly displaced right posterior tenth rib fracture. No  pneumothorax.     2. Incidental note is made of a of focal lucency in the proximal  right humerus. Right humerus series suggested for further  evaluation.   Pt does report hx of biceps rupture on right side; question if this is lucency they are experiencing. Pending xray of humerus.   At shift change case signed out to oncoming ED provider Brent Geralds, PA-C who will  dispo patient accordingly pending xray. Pt provided with incentive spirometer with instructions on same and discharged with pain meds and PCP follow up.   This note was prepared using Dragon voice recognition software and may include unintentional dictation errors due to the inherent limitations of voice recognition software.  Final Clinical Impression(s) / ED Diagnoses Final diagnoses:  Fall, initial encounter  Closed fracture of one rib of right side, initial encounter    Rx / DC Orders ED Discharge Orders          Ordered    oxyCODONE-acetaminophen (PERCOCET/ROXICET) 5-325 MG tablet  Every 6 hours PRN        07/24/20 1501             Tanda Rockers, PA-C 07/24/20 1522    Bethann Berkshire, MD 07/27/20 1654

## 2021-03-16 IMAGING — CR DG CHEST 2V
2 series · 2 of 2 positions shown · non-contrast
Comparison: None.

CLINICAL DATA: Chronic recurring cough.

EXAM:
CHEST - 2 VIEW

[w chest pa]
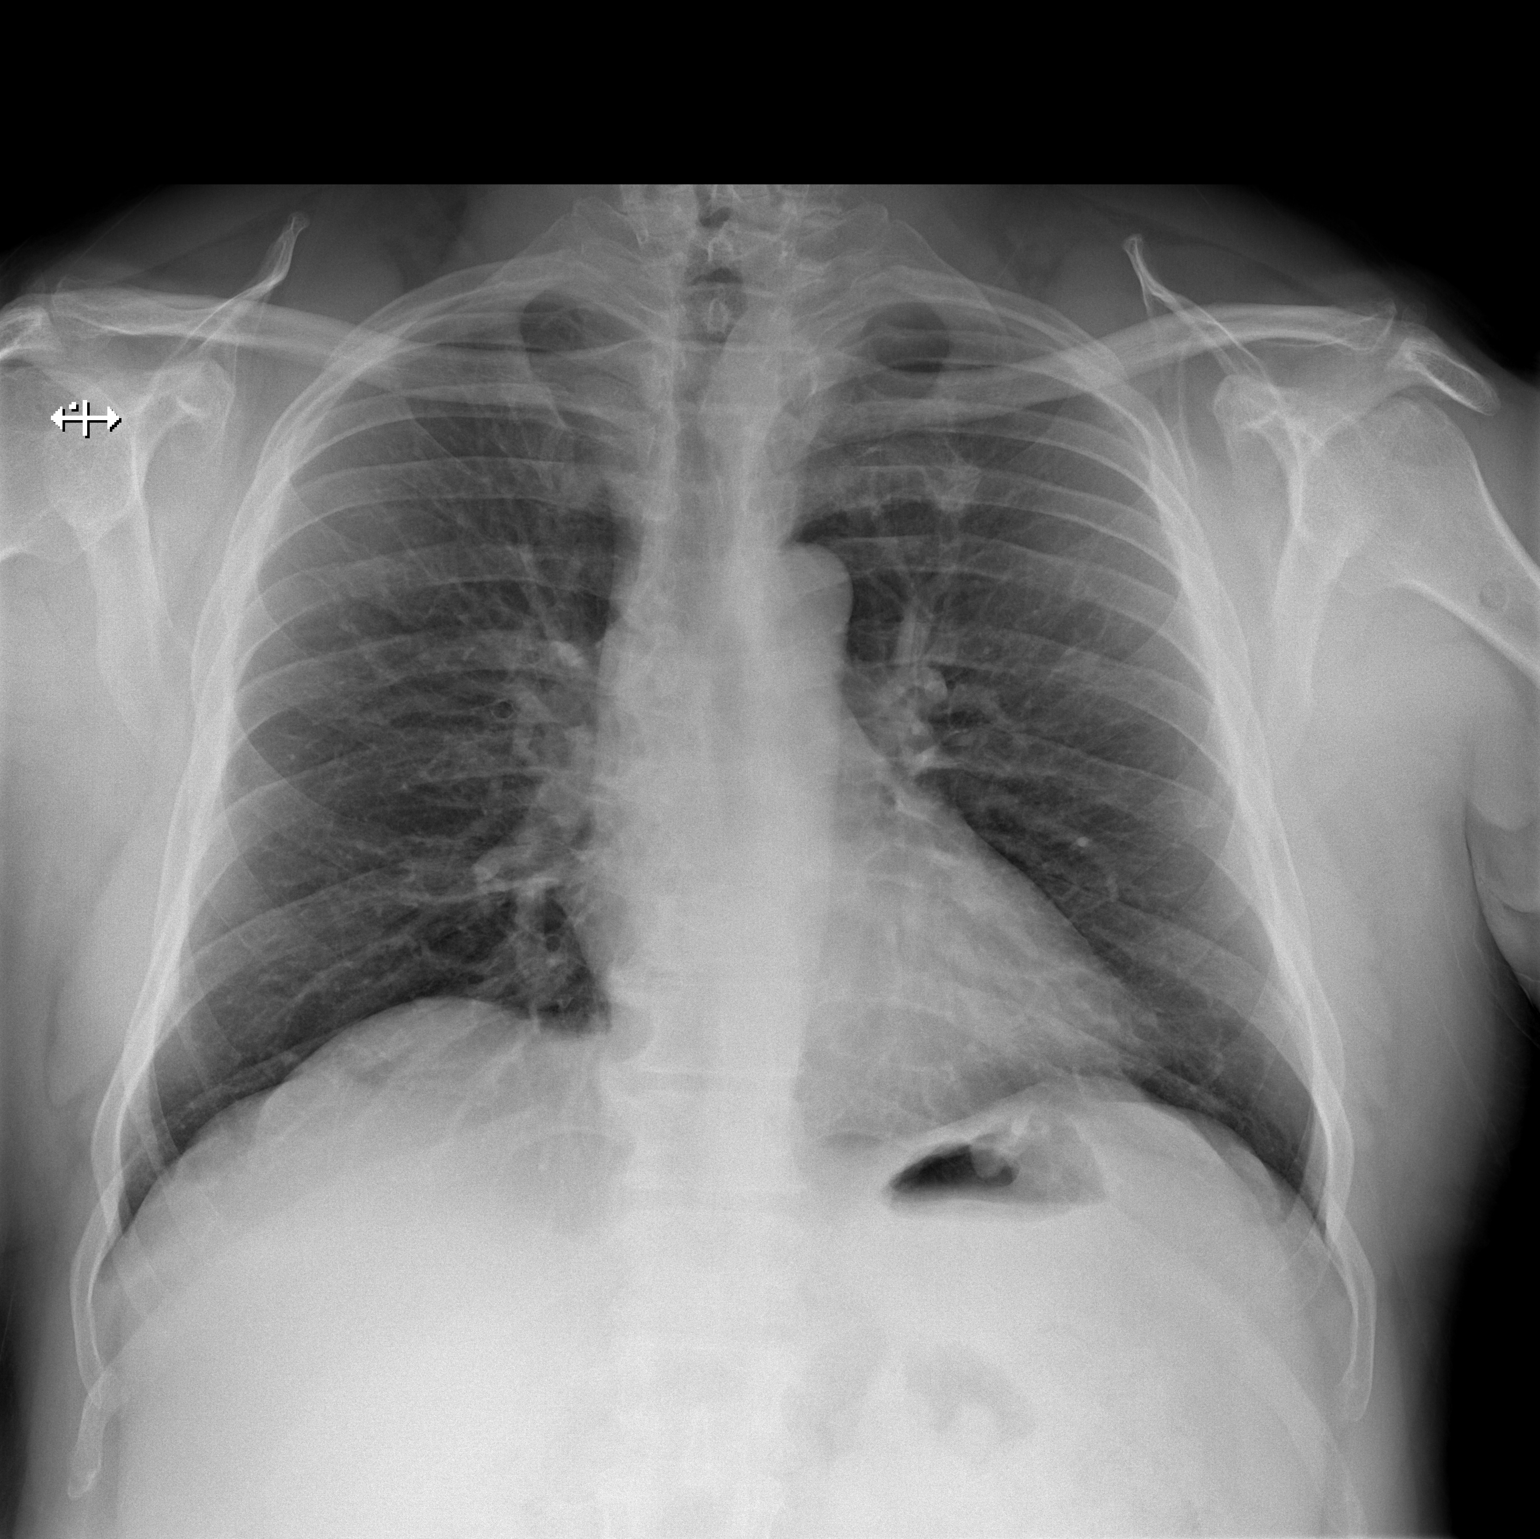

[w chest lat]
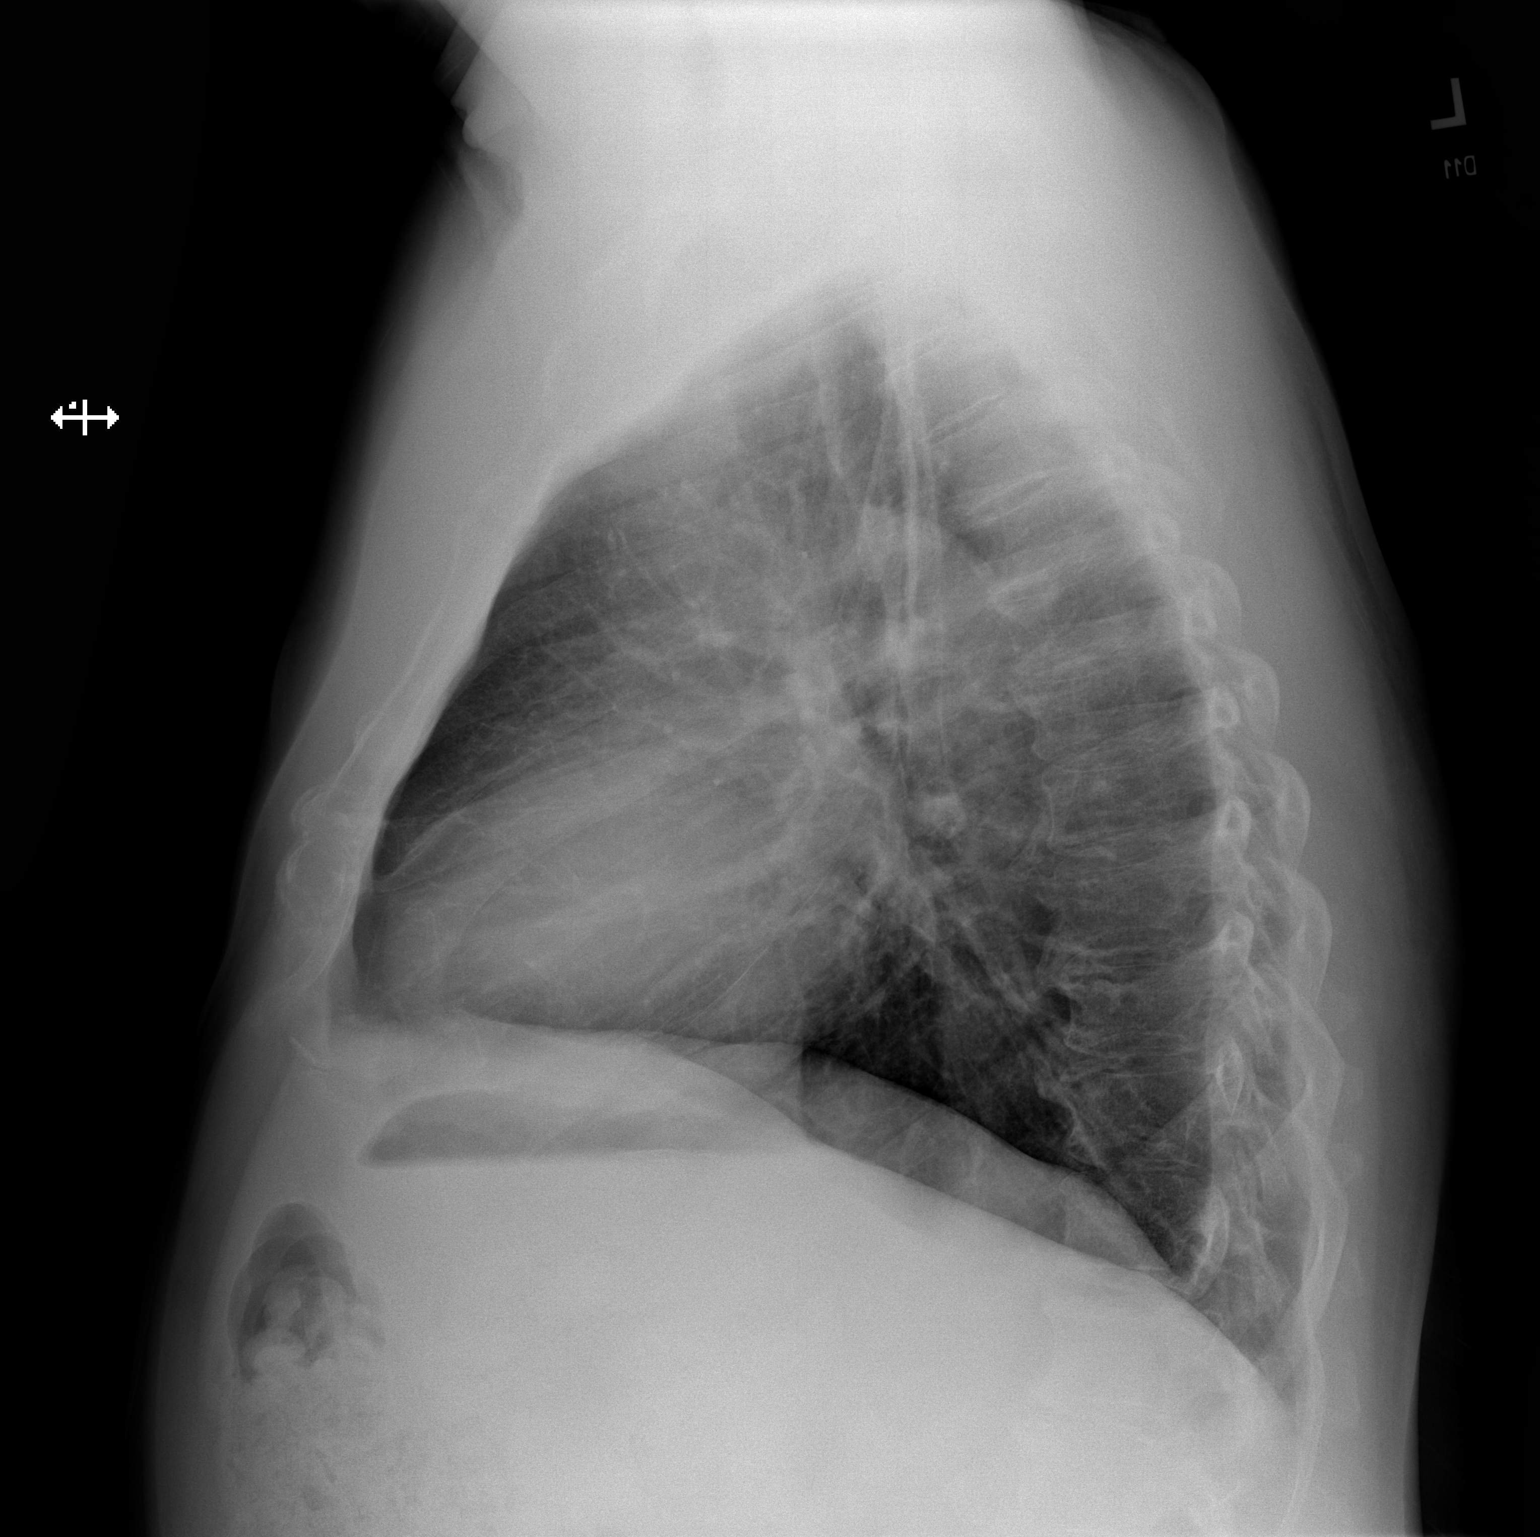

[2 of 2 positions shown; findings below may reference images not displayed]

FINDINGS: The heart size and mediastinal contours are within normal limits. No
focal consolidation. No visible pneumothorax. No pleural effusions.
Thoracic spondylosis. Degenerative change bilateral AC joints.
IMPRESSION: No active cardiopulmonary disease.

## 2021-07-13 ENCOUNTER — Other Ambulatory Visit: Payer: Self-pay

## 2021-07-13 ENCOUNTER — Encounter (HOSPITAL_COMMUNITY): Payer: Self-pay

## 2021-07-13 ENCOUNTER — Emergency Department (HOSPITAL_COMMUNITY)
Admission: EM | Admit: 2021-07-13 | Discharge: 2021-07-13 | Disposition: A | Discharge status: Home / Self Care | Payer: Medicare Other | Attending: Emergency Medicine | Admitting: Emergency Medicine

## 2021-07-13 DIAGNOSIS — R319 Hematuria, unspecified: Secondary | ICD-10-CM | POA: Diagnosis present

## 2021-07-13 DIAGNOSIS — D582 Other hemoglobinopathies: Secondary | ICD-10-CM | POA: Insufficient documentation

## 2021-07-13 DIAGNOSIS — Z7982 Long term (current) use of aspirin: Secondary | ICD-10-CM | POA: Diagnosis not present

## 2021-07-13 LAB — BASIC METABOLIC PANEL
Anion gap: 9 (ref 5–15)
BUN: 13 mg/dL (ref 6–20)
CO2: 27 mmol/L (ref 22–32)
Calcium: 9.7 mg/dL (ref 8.9–10.3)
Chloride: 103 mmol/L (ref 98–111)
Creatinine, Ser: 1.05 mg/dL (ref 0.61–1.24)
GFR, Estimated: >60 mL/min (ref >60)
Glucose, Bld: 118 mg/dL — ABNORMAL HIGH (ref 70–99)
Potassium: 4 mmol/L (ref 3.5–5.1)
Sodium: 139 mmol/L (ref 135–145)

## 2021-07-13 LAB — CBC WITH DIFFERENTIAL/PLATELET
Abs Immature Granulocytes: 0.02 10*3/uL (ref 0.00–0.07)
Basophils Absolute: 0 10*3/uL (ref 0.0–0.1)
Basophils Relative: 1 %
Eosinophils Absolute: 0.1 10*3/uL (ref 0.0–0.5)
Eosinophils Relative: 1 %
HCT: 50.9 % (ref 39.0–52.0)
Hemoglobin: 17.5 g/dL — ABNORMAL HIGH (ref 13.0–17.0)
Immature Granulocytes: 0 %
Lymphocytes Relative: 28 %
Lymphs Abs: 1.8 10*3/uL (ref 0.7–4.0)
MCH: 31.5 pg (ref 26.0–34.0)
MCHC: 34.4 g/dL (ref 30.0–36.0)
MCV: 91.5 fL (ref 80.0–100.0)
Monocytes Absolute: 0.6 10*3/uL (ref 0.1–1.0)
Monocytes Relative: 10 %
Neutro Abs: 3.9 10*3/uL (ref 1.7–7.7)
Neutrophils Relative %: 60 %
Platelets: 261 10*3/uL (ref 150–400)
RBC: 5.56 MIL/uL (ref 4.22–5.81)
RDW: 13.2 % (ref 11.5–15.5)
WBC: 6.5 10*3/uL (ref 4.0–10.5)
nRBC: 0 % (ref 0.0–0.2)

## 2021-07-13 LAB — URINALYSIS, ROUTINE W REFLEX MICROSCOPIC
Bilirubin Urine: NEGATIVE
Glucose, UA: NEGATIVE mg/dL
Ketones, ur: NEGATIVE mg/dL
Leukocytes,Ua: NEGATIVE
Nitrite: NEGATIVE
Protein, ur: NEGATIVE mg/dL
Specific Gravity, Urine: 1.015 (ref 1.005–1.030)
pH: 6 (ref 5.0–8.0)

## 2021-07-13 NOTE — ED Notes (Signed)
Pt states understanding of dc instructions, importance of follow up.  Pt denies questions or concerns upon dc. Pt declined wheelchair assistance upon dc. Pt ambulated out of ed w/ steady gait. No belongings left in room upon dc.  

## 2021-07-13 NOTE — ED Provider Notes (Signed)
[facility]-EMERGENCY DEPT Provider Note   CSN: [id number] Arrival date & time: 07/13/21  1002     History  Chief Complaint  Patient presents with   Hematuria    Brent Parks is a 56 y.o. male.  56 year old male with prior medical history as detailed below presents for evaluation.  Patient reports intermittent gross hematuria.  This is been a problem for the last 3 months.  Patient reports that the symptoms will come and go.  Patient reports seeing bright red blood in his urine and this morning.  He went to urgent care who referred him to the ED for evaluation.  Patient denies recent urologic work-up.  Patient denies feeling lightheaded or weak.  He denies fever.  He denies abdominal pain.  Patient does have a known thyroid nodule that he is scheduled for outpatient work-up and evaluation.  The history is provided by the patient and medical records.  Hematuria This is a recurrent problem. The current episode started more than 1 week ago. The problem occurs every several days. The problem has not changed since onset.Nothing aggravates the symptoms. Nothing relieves the symptoms.       Home Medications Prior to Admission medications   Medication Sig Start Date End Date Taking? Authorizing Provider  amphetamine-dextroamphetamine (ADDERALL XR) 20 MG 24 hr capsule Take 20 mg by mouth daily.     [provider]  aspirin 81 MG EC tablet Take 81 mg by mouth daily.    [provider]  benzonatate (TESSALON) 100 MG capsule Take 100 mg by mouth 3 (three) times daily as needed for cough. 12/18/19   [provider]  carbamazepine (TEGRETOL) 200 MG tablet Take 800 mg by mouth at bedtime.    [provider]  escitalopram (LEXAPRO) 20 MG tablet Take 20 mg by mouth 2 (two) times daily.    [provider]  lamoTRIgine (LAMICTAL) 200 MG tablet Take 400 mg by mouth daily.     [provider]  Multiple Vitamin (MULTIVITAMIN  WITH MINERALS) TABS tablet Take 1 tablet by mouth daily.    [provider]  Omega-3 Fatty Acids (FISH OIL PO) Take 1 tablet by mouth daily.    [provider]  oxyCODONE-acetaminophen (PERCOCET/ROXICET) 5-325 MG tablet Take 1 tablet by mouth every 6 (six) hours as needed for severe pain. 07/24/20   Tanda Rockers, PA-C  oxymetazoline (AFRIN) 0.05 % nasal spray Place 1 spray into both nostrils 2 (two) times daily as needed for congestion.    [provider]  Phenyleph-Doxylamine-DM-APAP (NYQUIL SEVERE COLD/FLU) 5-6.25-10-325 MG/15ML LIQD Take 15 mLs by mouth as needed (congestion).    [provider]  polyvinyl alcohol (LIQUIFILM TEARS) 1.4 % ophthalmic solution Place 1 drop into both eyes as needed for dry eyes.    [provider]  predniSONE (DELTASONE) 20 MG tablet Take 40 mg by mouth daily. 5 day supply 12/18/19   [provider]  traZODone (DESYREL) 50 MG tablet Take 50-150 mg by mouth at bedtime. 08/20/19   [provider]      Allergies    Patient has no known allergies.    Review of Systems   Review of Systems  Genitourinary:  Positive for hematuria.  All other systems reviewed and are negative.   Physical Exam Updated Vital Signs BP (!) 147/100   Pulse 96   Temp 98.2 F (36.8 C) (Oral)   Resp 18   Ht 5\' 6"  (1.676 m)   Wt  93 kg   SpO2 94%   BMI 33.09 kg/m  Physical Exam Vitals and nursing note reviewed.  Constitutional:      General: He is not in acute distress.    Appearance: Normal appearance. He is well-developed.  HENT:     Head: Normocephalic and atraumatic.  Eyes:     Conjunctiva/sclera: Conjunctivae normal.     Pupils: Pupils are equal, round, and reactive to light.  Cardiovascular:     Rate and Rhythm: Normal rate and regular rhythm.     Heart sounds: Normal heart sounds.  Pulmonary:     Effort: Pulmonary effort is normal. No respiratory distress.     Breath sounds: Normal breath sounds.   Abdominal:     General: There is no distension.     Palpations: Abdomen is soft.     Tenderness: There is no abdominal tenderness.  Musculoskeletal:        General: No deformity. Normal range of motion.     Cervical back: Normal range of motion and neck supple.  Skin:    General: Skin is warm and dry.  Neurological:     General: No focal deficit present.     Mental Status: He is alert and oriented to person, place, and time.    ED Results / Procedures / Treatments   Labs (all labs ordered are listed, but only abnormal results are displayed) Labs Reviewed  BASIC METABOLIC PANEL - Abnormal; Notable for the following components:      Result Value   Glucose, Bld 118 (*)    All other components within normal limits  CBC WITH DIFFERENTIAL/PLATELET - Abnormal; Notable for the following components:   Hemoglobin 17.5 (*)    All other components within normal limits  URINALYSIS, ROUTINE W REFLEX MICROSCOPIC    EKG None  Radiology No results found.  Procedures Procedures    Medications Ordered in ED Medications - No data to display  ED Course/ Medical Decision Making/ A&P                           Medical Decision Making Amount and/or Complexity of Data Reviewed Labs: ordered.    Medical Screen Complete  This patient presented to the ED with complaint of hematuria.  This complaint involves an extensive number of treatment options. The initial differential diagnosis includes, but is not limited to, infection, anemia, metabolic abnormality, etc.  This presentation is: Chronic, Self-Limited, Previously Undiagnosed, Uncertain Prognosis, Complicated, Systemic Symptoms, and Threat to Life/Bodily Function  Patient is presenting with complaint of chronic intermittent hematuria.  Patient is in no distress at time of evaluation.  Screening labs obtained.  Hemoglobin today is 17.5.  UA obtained is without gross hematuria.  Patient understands need for close outpatient  follow-up with urology.  Strict return precautions given and understood.    Additional history obtained:  External records from outside sources obtained and reviewed including prior ED visits and prior Inpatient records.    Lab Tests:  I ordered and personally interpreted labs.  The pertinent results include: CBC, BMP, UA  Problem List / ED Course:  Hematuria   Reevaluation:  After the interventions noted above, I reevaluated the patient and found that they have: improved  Disposition:  After consideration of the diagnostic results and the patients response to treatment, I feel that the patent would benefit from close outpatient follow-up.          Final Clinical Impression(s) / ED  Diagnoses Final diagnoses:  Hematuria, unspecified type    Rx / DC Orders ED Discharge Orders     None         Wynetta Fines, MD 07/13/21 1243

## 2021-08-11 ENCOUNTER — Other Ambulatory Visit (HOSPITAL_COMMUNITY): Payer: Self-pay

## 2021-08-11 MED ORDER — AMPHETAMINE-DEXTROAMPHET ER 30 MG PO CP24
ORAL_CAPSULE | ORAL | 0 refills | Status: DC
  Filled 2021-08-11: qty 30, 30d supply, fill #0

## 2021-08-11 MED ORDER — AMPHETAMINE-DEXTROAMPHET ER 30 MG PO CP24
30.0000 mg | ORAL_CAPSULE | Freq: Every morning | ORAL | 0 refills | Status: DC
  Filled 2021-08-11: qty 30, 30d supply, fill #0

## 2021-09-16 ENCOUNTER — Other Ambulatory Visit (HOSPITAL_COMMUNITY): Payer: Self-pay

## 2021-09-16 MED ORDER — CARBAMAZEPINE 200 MG PO TABS
ORAL_TABLET | ORAL | 0 refills | Status: DC
  Filled 2021-09-16: qty 120, 30d supply, fill #0

## 2021-09-16 MED ORDER — AMPHETAMINE-DEXTROAMPHET ER 30 MG PO CP24
30.0000 mg | ORAL_CAPSULE | Freq: Every morning | ORAL | 0 refills | Status: DC
  Filled 2021-09-16: qty 30, 30d supply, fill #0

## 2021-09-17 ENCOUNTER — Other Ambulatory Visit (HOSPITAL_COMMUNITY): Payer: Self-pay

## 2021-10-20 IMAGING — DX DG HUMERUS 2V *R*
3 series · 3 of 3 positions shown · non-contrast
Comparison: Chest/right rib radiographs 07/24/2020. Right shoulder
radiograph 07/19/2016. Right shoulder MRI 07/26/2016.

CLINICAL DATA: Right humerus lesion seen on chest/rib radiograph.

EXAM:
RIGHT HUMERUS - 2+ VIEW

[humerus ap (1 of 2)]
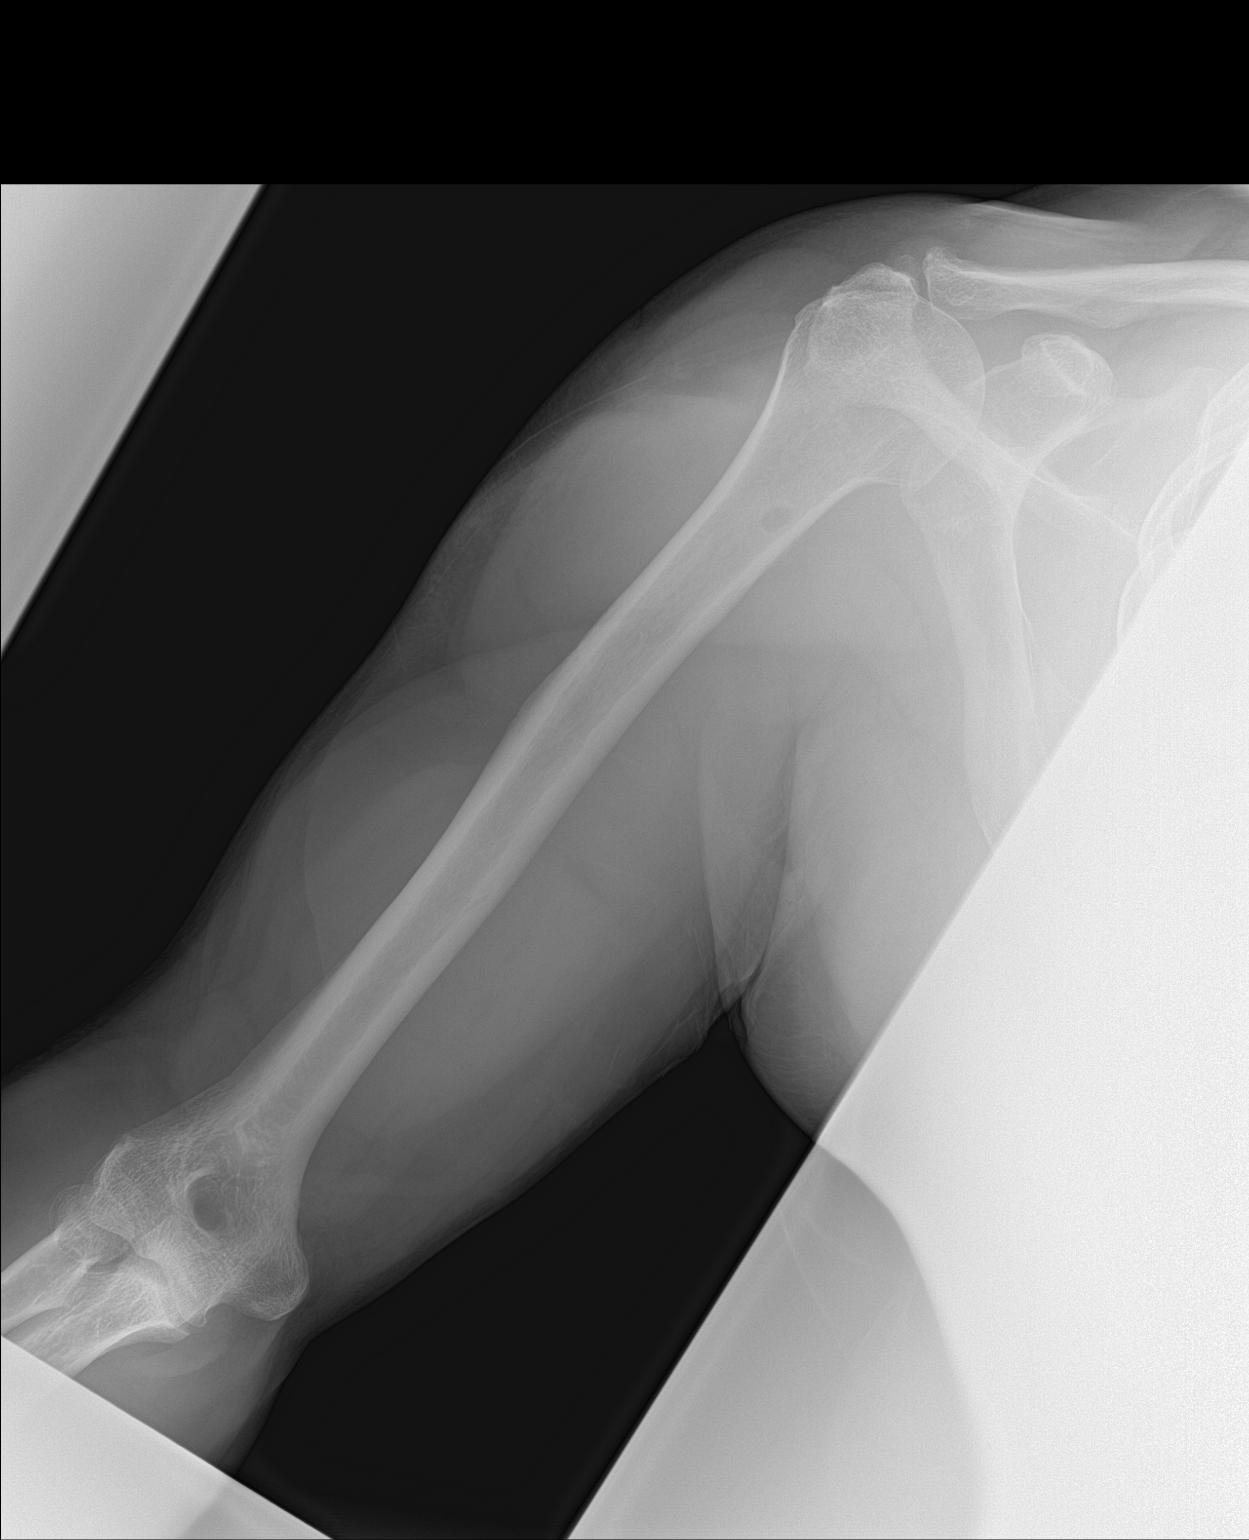

[humerus lat]
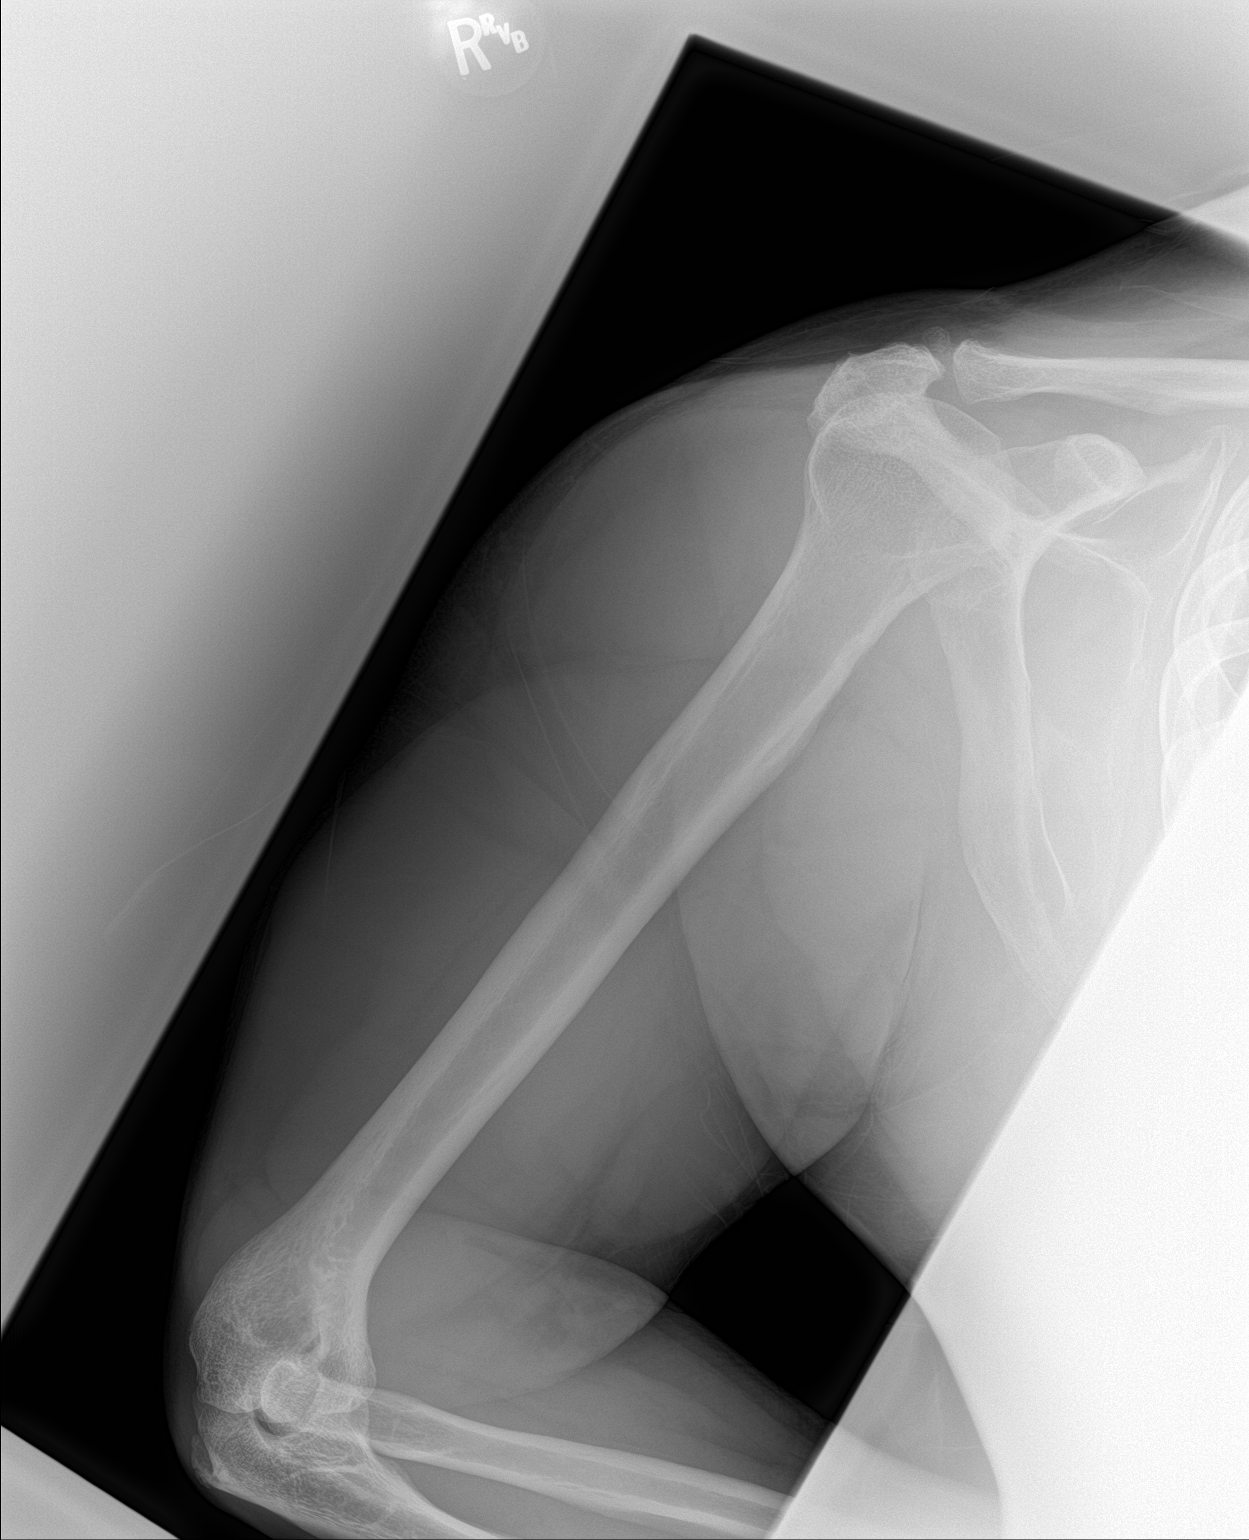

[humerus ap (2 of 2)]
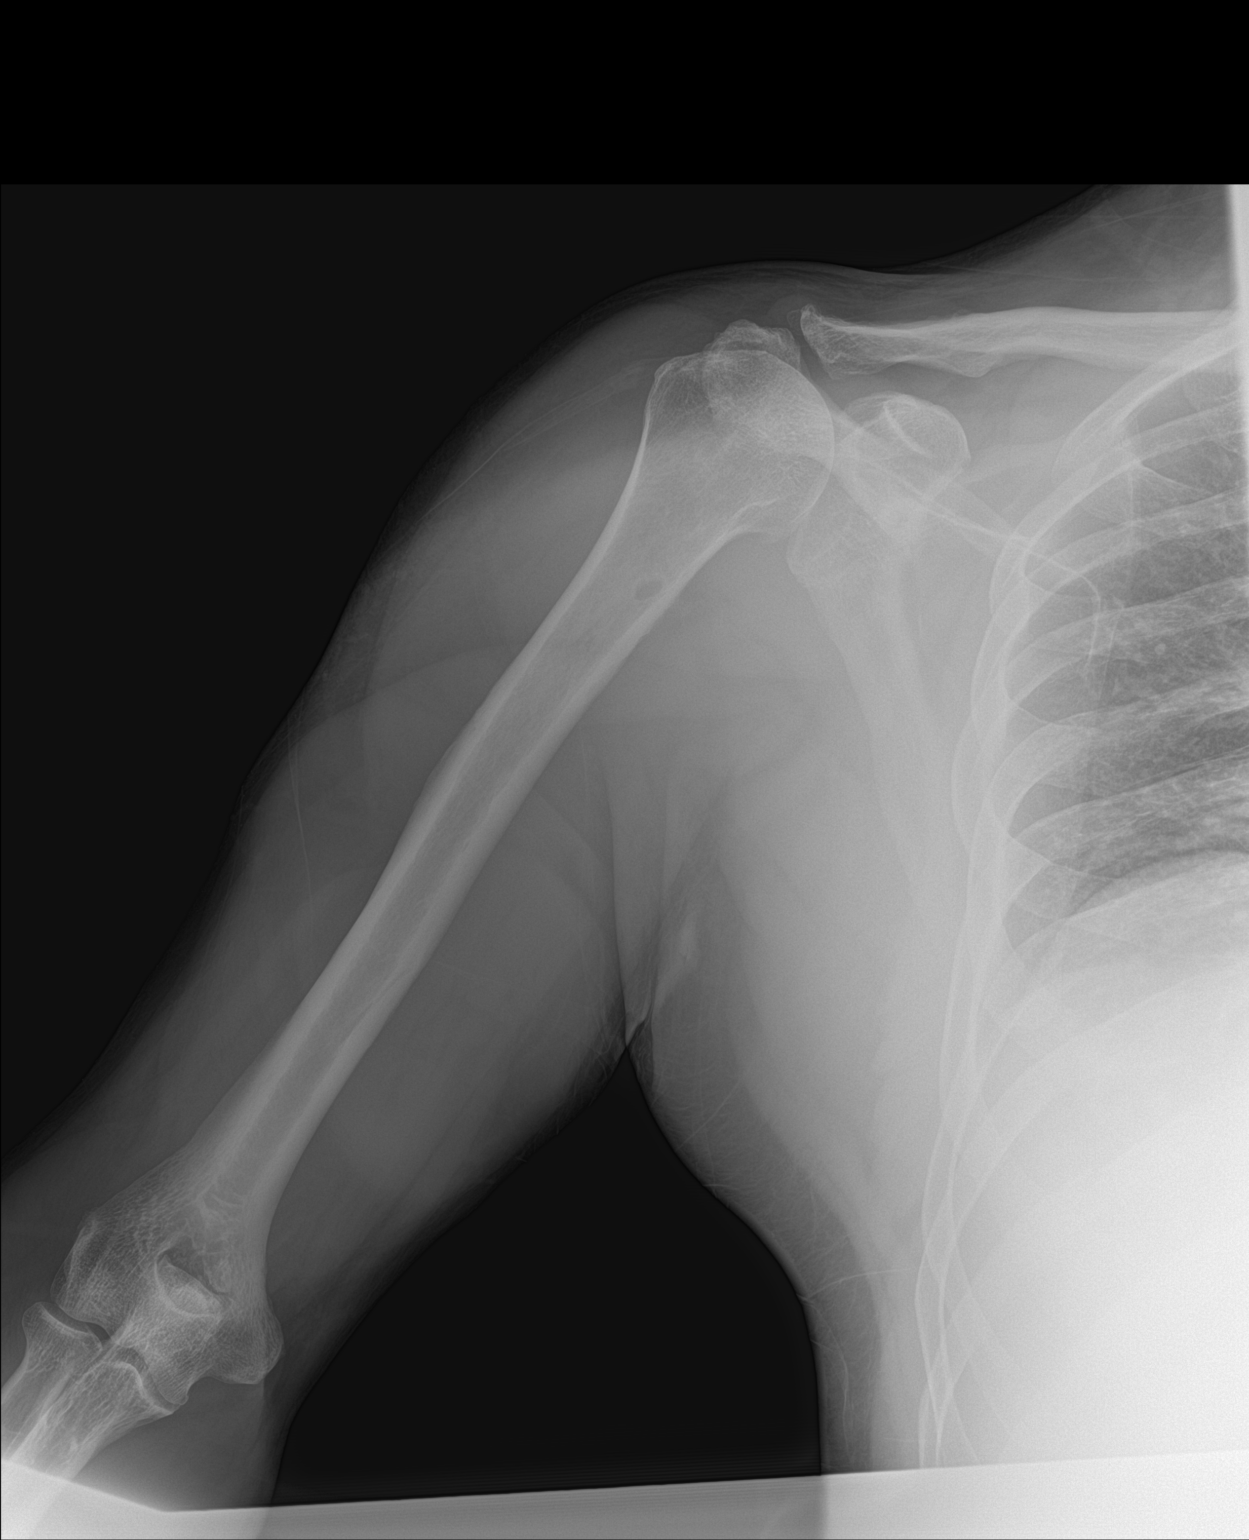

[3 of 3 positions shown; findings below may reference images not displayed]

FINDINGS: As seen on today's earlier rib series, there is a 1 cm well-defined
lucent lesion in the proximal shaft of the right humerus with thin
sclerotic margin. This is new from the 7787 radiographs and MRI. No
other bone lesion is identified. There is no humerus fracture. The
ribs were better evaluated on today's earlier dedicated radiographs.
There is mild acromioclavicular osteoarthrosis. The soft tissues are
unremarkable.
IMPRESSION: 1 cm lucent lesion in the proximal right humerus, new from 7787 and
nonspecific. Consider evaluation for multiple myeloma.

## 2021-11-05 ENCOUNTER — Other Ambulatory Visit (HOSPITAL_COMMUNITY): Payer: Self-pay

## 2021-11-05 MED ORDER — AMPHETAMINE-DEXTROAMPHET ER 30 MG PO CP24
30.0000 mg | ORAL_CAPSULE | Freq: Every morning | ORAL | 0 refills | Status: DC
  Filled 2021-11-05: qty 30, 30d supply, fill #0

## 2021-11-06 ENCOUNTER — Other Ambulatory Visit (HOSPITAL_COMMUNITY): Payer: Self-pay

## 2022-03-03 ENCOUNTER — Other Ambulatory Visit (HOSPITAL_COMMUNITY): Payer: Self-pay

## 2022-10-22 ENCOUNTER — Encounter (HOSPITAL_COMMUNITY): Payer: Self-pay | Admitting: Emergency Medicine

## 2022-10-22 ENCOUNTER — Emergency Department (HOSPITAL_COMMUNITY): Payer: Medicare Other

## 2022-10-22 ENCOUNTER — Emergency Department (HOSPITAL_COMMUNITY)
Admission: EM | Admit: 2022-10-22 | Discharge: 2022-10-22 | Disposition: A | Discharge status: Home / Self Care | Payer: Medicare Other | Attending: Emergency Medicine | Admitting: Emergency Medicine

## 2022-10-22 ENCOUNTER — Other Ambulatory Visit: Payer: Self-pay

## 2022-10-22 DIAGNOSIS — W01198A Fall on same level from slipping, tripping and stumbling with subsequent striking against other object, initial encounter: Secondary | ICD-10-CM | POA: Diagnosis not present

## 2022-10-22 DIAGNOSIS — S59912A Unspecified injury of left forearm, initial encounter: Secondary | ICD-10-CM | POA: Diagnosis present

## 2022-10-22 DIAGNOSIS — S51822A Laceration with foreign body of left forearm, initial encounter: Secondary | ICD-10-CM | POA: Insufficient documentation

## 2022-10-22 DIAGNOSIS — Z23 Encounter for immunization: Secondary | ICD-10-CM | POA: Insufficient documentation

## 2022-10-22 DIAGNOSIS — Z7982 Long term (current) use of aspirin: Secondary | ICD-10-CM | POA: Insufficient documentation

## 2022-10-22 DIAGNOSIS — T148XXA Other injury of unspecified body region, initial encounter: Secondary | ICD-10-CM

## 2022-10-22 DIAGNOSIS — S80812A Abrasion, left lower leg, initial encounter: Secondary | ICD-10-CM | POA: Insufficient documentation

## 2022-10-22 DIAGNOSIS — S8011XA Contusion of right lower leg, initial encounter: Secondary | ICD-10-CM | POA: Insufficient documentation

## 2022-10-22 DIAGNOSIS — S41112A Laceration without foreign body of left upper arm, initial encounter: Secondary | ICD-10-CM

## 2022-10-22 MED ORDER — CEPHALEXIN 500 MG PO CAPS
500.0000 mg | ORAL_CAPSULE | Freq: Once | ORAL | Status: AC
  Administered 2022-10-22: 500 mg via ORAL
  Filled 2022-10-22: qty 1

## 2022-10-22 MED ORDER — TETANUS-DIPHTH-ACELL PERTUSSIS 5-2.5-18.5 LF-MCG/0.5 IM SUSY
0.5000 mL | PREFILLED_SYRINGE | Freq: Once | INTRAMUSCULAR | Status: AC
  Administered 2022-10-22: 0.5 mL via INTRAMUSCULAR
  Filled 2022-10-22: qty 0.5

## 2022-10-22 MED ORDER — OXYCODONE-ACETAMINOPHEN 5-325 MG PO TABS: 1.0000 | ORAL_TABLET | Freq: Four times a day (QID) | ORAL | 0 refills | Status: DC | PRN

## 2022-10-22 MED ORDER — LIDOCAINE-EPINEPHRINE (PF) 2 %-1:200000 IJ SOLN
10.0000 mL | Freq: Once | INTRAMUSCULAR | Status: AC
  Administered 2022-10-22: 10 mL
  Filled 2022-10-22: qty 20

## 2022-10-22 MED ORDER — CEPHALEXIN 500 MG PO CAPS: 500.0000 mg | ORAL_CAPSULE | Freq: Four times a day (QID) | ORAL | 0 refills | Status: DC

## 2022-10-22 NOTE — ED Triage Notes (Signed)
While trying to throw trash away, the patient fell and landed on glass. He was able to removed the glass from his arm, but it left a laceration to his right forearm. Patient also has scrapes to his legs. A pressure dressing is in place to help control the bleeding.

## 2022-10-22 NOTE — ED Provider Notes (Signed)
Brent Parks Provider Note   CSN: [id number] Arrival date & time: 10/22/22  1544     History  Chief Complaint  Patient presents with   Fall   Laceration    Brent Parks is a 57 y.o. male.  Patient presents to the emergency department today for evaluation of left forearm puncture wound occurring just prior to arrival.  Patient was helping someone put a bag of garbage into a dumpster.  While doing this glass punctured through the bag and went into his left forearm.  He states that he had to pull out a piece of glass that was a couple centimeters in size.  Bleeding was controlled with pressure.  He also fell off the tailgate of a truck and had some abrasions to his bilateral lower shins.  In the left upper extremity, no distal numbness or tingling.  He is able to move fingers, hand, and wrist fully without difficulty.  Unknown last tetanus and is requesting update.  No other injuries.      Home Medications Prior to Admission medications   Medication Sig Start Date End Date Taking? Authorizing Provider  amphetamine-dextroamphetamine (ADDERALL XR) 20 MG 24 hr capsule Take 20 mg by mouth daily.     [provider]  amphetamine-dextroamphetamine (ADDERALL XR) 30 MG 24 hr capsule Take 1 capsule (30 mg total) by mouth every morning. 11/05/21     aspirin 81 MG EC tablet Take 81 mg by mouth daily.    [provider]  benzonatate (TESSALON) 100 MG capsule Take 100 mg by mouth 3 (three) times daily as needed for cough. 12/18/19   [provider]  carbamazepine (TEGRETOL) 200 MG tablet Take 800 mg by mouth at bedtime.    [provider]  carbamazepine (TEGRETOL) 200 MG tablet TAKE 4 TABLETS BY MOUTH EVERY NIGHT AT BEDTIME 09/16/21     escitalopram (LEXAPRO) 20 MG tablet Take 20 mg by mouth 2 (two) times daily.    [provider]  lamoTRIgine (LAMICTAL) 200 MG tablet Take 400 mg by mouth daily.     [provider]  Multiple Vitamin (MULTIVITAMIN WITH MINERALS) TABS tablet Take 1 tablet by mouth daily.    [provider]  Omega-3 Fatty Acids (FISH OIL PO) Take 1 tablet by mouth daily.    [provider]  oxyCODONE-acetaminophen (PERCOCET/ROXICET) 5-325 MG tablet Take 1 tablet by mouth every 6 (six) hours as needed for severe pain. 07/24/20   Tanda Rockers, PA-C  oxymetazoline (AFRIN) 0.05 % nasal spray Place 1 spray into both nostrils 2 (two) times daily as needed for congestion.    [provider]  Phenyleph-Doxylamine-DM-APAP (NYQUIL SEVERE COLD/FLU) 5-6.25-10-325 MG/15ML LIQD Take 15 mLs by mouth as needed (congestion).    [provider]  polyvinyl alcohol (LIQUIFILM TEARS) 1.4 % ophthalmic solution Place 1 drop into both eyes as needed for dry eyes.    [provider]  predniSONE (DELTASONE) 20 MG tablet Take 40 mg by mouth daily. 5 day supply 12/18/19   [provider]  traZODone (DESYREL) 50 MG tablet Take 50-150 mg by mouth at bedtime. 08/20/19   [provider]      Allergies    Patient has no known allergies.    Review of Systems   Review of Systems  Physical Exam Updated Vital Signs BP (!) 152/100   Pulse (!) 105   Temp 98.4 F (36.9 C) (Oral)   Resp 20  SpO2 94%  Physical Exam Vitals and nursing note reviewed.  Constitutional:      Appearance: He is well-developed.  HENT:     Head: Normocephalic and atraumatic.  Eyes:     Conjunctiva/sclera: Conjunctivae normal.  Cardiovascular:     Pulses: Normal pulses. No decreased pulses.  Musculoskeletal:        General: Tenderness present.     Cervical back: Normal range of motion and neck supple.     Right lower leg: No edema.     Left lower leg: No edema.     Comments: Left forearm:2cm laceration explored, no FB palpated or visualized prior to and after first round of irrigation.  Wound extends towards the muscle of the forearm.  Wound is oblique and tracks  between the subcutaneous tissues and the muscle belly, not penetrating more deeply.  No foreign bodies visualized.  There is a smaller puncture near the larger laceration.  Given small size, unable to visualize wound base.  As per ED course, foreign body suspected to be present given x-ray and bedside ultrasound evaluation.  Wound is otherwise clean, hemostatic.  There is some visualized superficial subcutaneous fat noted in the periphery of each wound.  Full range of motion distal to the injury of the wrist, hand, fingers.  Skin:    General: Skin is warm and dry.     Comments: Patient with a small hematoma to the right lower leg without bleeding and a more substantial superficial abrasion to the left lower leg without bleeding.  Patient is ambulatory and bearing weight without difficulty.  Neurological:     Mental Status: He is alert.     Sensory: No sensory deficit.     Comments: Motor, sensation, and vascular distal to the injury is fully intact.   Psychiatric:        Mood and Affect: Mood normal.     ED Results / Procedures / Treatments   Labs (all labs ordered are listed, but only abnormal results are displayed) Labs Reviewed - No data to display  EKG None  Radiology No results found.  Procedures .Marland KitchenLaceration Repair  Date/Time: 10/22/2022 7:17 PM  Performed by: Renne Crigler, PA-C Authorized by: Renne Crigler, PA-C   Consent:    Consent obtained:  Verbal   Consent given by:  Patient   Risks discussed:  Infection and retained foreign body (Discussed risks of infection due to retained FB 2/2 glass) Universal protocol:    Patient identity confirmed:  Verbally with patient and provided demographic data Anesthesia:    Anesthesia method:  Local infiltration   Local anesthetic:  Lidocaine 2% WITH epi Laceration details:    Location:  Shoulder/arm   Shoulder/arm location:  L lower arm   Length (cm):  2 Pre-procedure details:    Preparation:  Patient was prepped and  draped in usual sterile fashion and imaging obtained to evaluate for foreign bodies Exploration:    Limited defect created (wound extended): yes     Imaging obtained: bedside ultrasound     Imaging outcome: foreign body noted     Wound extent: foreign bodies/material     Foreign bodies/material:  Glass Treatment:    Area cleansed with:  Saline   Amount of cleaning:  Extensive   Irrigation solution:  Sterile saline   Irrigation volume:  2000cc   Irrigation method:  Pressure wash Skin repair:    Repair method:  Steri-Strips Approximation:    Approximation:  Loose Post-procedure details:    Dressing:  Open (no dressing)   Procedure completion:  Tolerated well, no immediate complications Comments:     Wound was initially irrigated with 1000 cc of saline under pressure.  After x-ray imaging, bedside ultrasound was used to localize a larger needlelike foreign body.  Extensive time spent at bedside localizing wound with bedside ultrasound and removal.  This involved extending the smaller wound with a scalpel blade and searching for an approximately 1 cm sliver of glass which was easily visualized with ultrasound.  Please see Dr. Ezequiel Essex note for further instruction.  He was able to remove the sliver of glass, in its entirety, with forceps.  Wound was then irrigated again with 1000 cc of of saline.  Steri-Strips were applied and then wound was bandaged with sterile gauze.  No sutures placed.     Medications Ordered in ED Medications  lidocaine-EPINEPHrine (XYLOCAINE W/EPI) 2 %-1:200000 (PF) injection 10 mL (has no administration in time range)  Tdap (BOOSTRIX) injection 0.5 mL (has no administration in time range)    ED Course/ Medical Decision Making/ A&P    Patient seen and examined. History obtained directly from patient.   Labs/EKG: None ordered  Imaging: Ordered x-ray of the left forearm.  Medications/Fluids: Ordered: Lidocaine 2% with epinephrine.   Most recent vital signs  reviewed and are as follows: BP (!) 152/100   Pulse (!) 105   Temp 98.4 F (36.9 C) (Oral)   Resp 20   SpO2 94%   Initial impression: Puncture wound left forearm, will need anesthesia, exploration, irrigation.  Will likely start on 5 days of Keflex.  Will update tetanus.  9:55 PM Reassessment performed.  Foreign body was noted within the wound on x-ray.  The foreign body was actually localized within the smaller wound on ultrasound.  Dr. Silverio Lay at bedside, using ultrasound, enlarged the wound with 11 blade and was eventually  able to deliver the foreign body.   Imaging personally visualized and interpreted including: Agree soft tissue defect, deeper sliver of glass and a smaller superficial piece of glass.  Reviewed pertinent lab work and imaging with patient at bedside. Questions answered.   Opted to leave wound open after extensive irrigation in the emergency department given infection risk.  Patient was started on Keflex.  Tetanus was updated.  Most current vital signs reviewed and are as follows: BP (!) 141/97   Pulse 93   Temp 98.2 F (36.8 C) (Oral)   Resp 18   SpO2 95%   Plan: Discharge to home.   Prescriptions written for: Keflex  Other home care instructions discussed: Patient counseled on wound care.    ED return instructions discussed: Patient was urged to return to the Emergency Department urgently with worsening pain, swelling, expanding erythema especially if it streaks away from the affected area, fever, or if they have any other concerns.   Follow-up instructions discussed: Patient counseled on need to return or see PCP in 3 to 5 days for wound recheck.                                 Medical Decision Making Amount and/or Complexity of Data Reviewed Radiology: ordered.  Risk Prescription drug management.   Patient with lacerations due to glass.  Foreign bodies contaminated wound.  The major foreign body was localized and recovered.  Wound is left open due to  infection risk.  Started on Keflex.  Tetanus updated.  No fractures.  No  other injuries.  The patient's vital signs, pertinent lab work and imaging were reviewed and interpreted as discussed in the ED course. Hospitalization was considered for further testing, treatments, or serial exams/observation. However as patient is well-appearing, has a stable exam, and reassuring studies today, I do not feel that they warrant admission at this time. This plan was discussed with the patient who verbalizes agreement and comfort with this plan and seems reliable and able to return to the Emergency Department with worsening or changing symptoms.          Final Clinical Impression(s) / ED Diagnoses Final diagnoses:  Laceration of left upper extremity, initial encounter  Foreign body in skin    Rx / DC Orders ED Discharge Orders          Ordered    oxyCODONE-acetaminophen (PERCOCET/ROXICET) 5-325 MG tablet  Every 6 hours PRN        10/22/22 2049    cephALEXin (KEFLEX) 500 MG capsule  4 times daily        10/22/22 2049              Renne Crigler, PA-C 10/22/22 2159    Charlynne Pander, MD 10/26/22 1301

## 2022-10-22 NOTE — Discharge Instructions (Addendum)
Please read and follow all provided instructions.  Your diagnoses today include:  1. Laceration of left upper extremity, initial encounter   2. Foreign body in skin     Tests performed today include: X-ray of the affected area that showed glass in wound, no broken bones Vital signs. See below for your results today.   Medications prescribed:  Keflex (cephalexin) - antibiotic  You have been prescribed an antibiotic medicine: take the entire course of medicine even if you are feeling better. Stopping early can cause the antibiotic not to work.'  Percocet (oxycodone/acetaminophen) - narcotic pain medication  DO NOT drive or perform any activities that require you to be awake and alert because this medicine can make you drowsy. BE VERY CAREFUL not to take multiple medicines containing Tylenol (also called acetaminophen). Doing so can lead to an overdose which can damage your liver and cause liver failure and possibly death.  Take any prescribed medications only as directed.   Home care instructions:  Follow any educational materials and wound care instructions contained in this packet.   Keep affected area above the level of your heart when possible to minimize swelling. Wash area gently twice a day with warm soapy water. Do not apply alcohol or hydrogen peroxide. Cover the area if it draining or weeping.   Follow-up instructions: Given that your wound is higher risk for infection, it would likely be best for you to have your wound rechecked in 3 to 5 days by your primary care doctor to make sure that it is healing appropriately without any complications.  Return instructions:  Return to the Emergency Department if you have: Fever Worsening pain Worsening swelling of the wound Pus draining from the wound Redness of the skin that moves away from the wound, especially if it streaks away from the affected area  Any other emergent concerns  Your vital signs today were: BP (!) 147/98    Pulse 81   Temp 98.4 F (36.9 C) (Oral)   Resp 18   SpO2 98%  If your blood pressure (BP) was elevated above 135/85 this visit, please have this repeated by your doctor within one month. --------------

## 2023-02-03 NOTE — Progress Notes (Signed)
02/04/23- 57 yoM never smoker for sleep evaluation courtesy of [facility] with concern of OSA Medical problem list includes HTN, Erythrocytosis, Epworth score-12 Body weight today-204 lbs -----Snoring, waking up gasping for air, restless sleep, daytime fatigue. ENT surgery - tonsils, septoplasty. Pneumonia- several episodes. Some examiners have heard heart murmur- inconsistent. Difficulty falling asleep, frequent waking, daytime sleepiness, naps. Used Trazodone, valium, carbamazepine. Doesn't think he can sleep now without his CPAP, which he got used from his mother-in-law. Uses Adderall 30 mgXr each morning for alertness. Does nap.   Discussed the use of AI scribe software for clinical note transcription with the patient, who gave verbal consent to proceed.  History of Present Illness   The patient, with a history of sleep apnea, insomnia, and respiratory issues, presents with ongoing struggles with his CPAP machine, from his mother in law,  he has been using for 14 years. The patient reports frequent snoring and gasping for air during sleep, which often leads to waking up. He also reports daytime fatigue and Insomnia with difficulty falling and staying asleep. The patient takes Trazodone 100 mg and Valium 10 mg to aid sleep, carbamazepine,  and Adderall to combat daytime fatigue.  The patient also reports a persistent cough and a history of pneumonias including Covid.Marland Kitchen Despite never having smoked, the patient describes a constant congestion in his lungs and a rattling sound. The patient has had multiple chest x-rays due to these respiratory issues. Last CXR in our system was 2022.  The patient also mentions a tremor and teeth grinding at night, which has been noticed by his dentist. He has not pursued a mouthpiece for this issue due to cost concerns. We discussed otc bruxism guards.   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      Prior to  Admission medications   Medication Sig Start Date End Date Taking? Authorizing Provider  albuterol (VENTOLIN HFA) 108 (90 Base) MCG/ACT inhaler Inhale into the lungs every 6 (six) hours as needed for wheezing or shortness of breath. 01/20/23  Yes [provider]  amphetamine-dextroamphetamine (ADDERALL XR) 30 MG 24 hr capsule Take 1 capsule (30 mg total) by mouth every morning. 11/05/21  Yes   aspirin 81 MG EC tablet Take 81 mg by mouth daily.   Yes [provider]  carbamazepine (TEGRETOL) 200 MG tablet Take 800 mg by mouth at bedtime.   Yes [provider]  diazepam (VALIUM) 5 MG tablet Take 2 tablets by mouth at bedtime. 01/11/23  Yes [provider]  escitalopram (LEXAPRO) 20 MG tablet Take 20 mg by mouth 2 (two) times daily.   Yes [provider]  ipratropium-albuterol (DUONEB) 0.5-2.5 (3) MG/3ML SOLN Inhale 3 mLs into the lungs every 6 (six) hours as needed. 12/09/21  Yes [provider]  lamoTRIgine (LAMICTAL) 200 MG tablet Take 400 mg by mouth daily.    Yes [provider]  Multiple Vitamin (MULTIVITAMIN WITH MINERALS) TABS tablet Take 1 tablet by mouth daily.   Yes [provider]  Omega-3 Fatty Acids (FISH OIL PO) Take 1 tablet by mouth daily.   Yes [provider]  oxymetazoline (AFRIN) 0.05 % nasal spray Place 1 spray into both nostrils 2 (two) times daily as needed for congestion.   Yes [provider]  polyvinyl alcohol (LIQUIFILM TEARS) 1.4 % ophthalmic solution Place 1 drop into both eyes as needed for dry eyes.   Yes [provider]  sildenafil (REVATIO) 20 MG tablet  Take 20 mg by mouth. 01/20/23  Yes [provider]  terbinafine (LAMISIL) 1 % cream Apply 1 Application topically daily. 01/20/23  Yes [provider]  testosterone cypionate (DEPOTESTOSTERONE CYPIONATE) 200 MG/ML injection Inject 200 mg into the muscle 2 (two) times a week. 01/20/23  Yes [provider]   traZODone (DESYREL) 50 MG tablet Take 50-150 mg by mouth at bedtime. 08/20/19  Yes [provider]   Past Medical History:  Diagnosis Date   Blood dyscrasia    erythrocytosis   Erythrocytosis    Hypertension    Neuromuscular disorder (HCC)    Obesity    Sleep apnea    Past Surgical History:  Procedure Laterality Date   TENDON MANIPULATION Right 11/2019   Family History  Problem Relation Age of Onset   COPD Mother    Social History   Socioeconomic History   Marital status: Married    Spouse name: Not on file   Number of children: Not on file   Years of education: Not on file   Highest education level: Not on file  Occupational History   Not on file  Tobacco Use   Smoking status: Never   Smokeless tobacco: Never  Vaping Use   Vaping status: Never Used  Substance and Sexual Activity   Alcohol use: Not Currently   Drug use: No   Sexual activity: Not on file  Other Topics Concern   Not on file  Social History Narrative   Not on file   Social Drivers of Health   Financial Resource Strain: High Risk (03/11/2022)   Received from [facility], [facility]   Overall Financial Resource Strain (CARDIA)    Difficulty of Paying Living Expenses: Hard  Food Insecurity: Food Insecurity Present (03/11/2022)   Received from [facility], [facility]   Hunger Vital Sign    Worried About Running Out of Food in the Last Year: Sometimes true    Ran Out of Food in the Last Year: Often true  Transportation Needs: No Transportation Needs (03/11/2022)   Received from [facility], [facility]   PRAPARE - Transportation    Lack of Transportation (Medical): No    Lack of Transportation (Non-Medical): No  Physical Activity: Unknown (03/11/2022)   Received from [facility], [facility]   Exercise Vital Sign    Days of Exercise per Week: 0 days    Minutes of Exercise per Session: Not on file  Stress: Stress Concern Present (03/11/2022)   Received from [facility],  [facility] - Occupational Stress Questionnaire    Feeling of Stress : Very much  Social Connections: Socially Isolated (03/11/2022)   Received from [facility], [facility]   Social Network    How would you rate your social network (family, work, friends)?: Little participation, lonely and socially isolated  Intimate Partner Violence: Unknown (03/11/2022)   Received from [facility], [facility]   HITS    Physically Hurt: Not on file    Over the last 12 months how often did your partner insult you or talk down to you?: Never    Over the last 12 months how often did your partner threaten you with physical harm?: Never    Over the last 12 months how often did your partner scream or curse at you?: Never   ROS-see HPI  + = positive Constitutional:    weight loss, night sweats, fevers, chills, fatigue, lassitude. HEENT:    +headaches,  difficulty swallowing, tooth/dental problems, sore throat,       sneezing, itching, ear ache, +nasal congestion, post nasal drip, snoring CV:    chest pain, orthopnea, PND, swelling in lower extremities, anasarca,                                   , palpitations Resp:   +shortness of breath with exertion or at rest.                +productive cough,   non-productive cough, coughing up of blood.              change in color of mucus.  wheezing.   Skin:    rash or lesions. GI:  No-   heartburn, indigestion, abdominal pain, nausea, vomiting, diarrhea,                 change in bowel habits, loss of appetite GU: dysuria, change in color of urine, no urgency or frequency.   flank pain. MS:  + joint pain, stiffness, decreased range of motion, back pain. Neuro-     nothing unusual Psych:  change in mood or affect.  +depression or +anxiety.   memory loss.  OBJ- Physical Exam General- Alert, Oriented, Affect-appropriate, Distress- none acute Skin- rash-none, lesions- none, excoriation- none Lymphadenopathy-  none Head- atraumatic            Eyes- Gross vision intact, PERRLA, conjunctivae and secretions clear            Ears- Hearing, canals-normal            Nose- Clear, no-Septal dev, mucus, polyps, erosion, perforation             Throat- Mallampati IV , mucosa clear , drainage- none, tonsils- absent, +teeth Neck- flexible , trachea midline, no stridor , thyroid nl, carotid no bruit Chest - symmetrical excursion , unlabored           Heart/CV- RRR , no murmur , no gallop  , no rub, nl s1 s2                           - JVD- none , edema- none, stasis changes- none, varices- none           Lung- clear to P&A, wheeze- none, cough- none , dullness-none, rub- none           Chest wall-  Abd-  Br/ Gen/ Rectal- Not done, not indicated Extrem- cyanosis- none, clubbing, none, atrophy- none, strength- nl Neuro- +tremor hands  Assessment and Plan    Obstructive Sleep Apnea Reports of loud snoring, daytime fatigue, and  Insomnia with difficulty falling and staying asleep. History of CPAP use. Medication regimen includes Trazodone 100mg  and Valium 10mg  at night for sleep, and Adderall 30mg  XR in the morning for wakefulness. -Order home sleep study to reassess severity of sleep apnea. -Consider updating CPAP machine and mask if indicated by sleep study results.  Chronic Bronchitis Reports of chronic lung congestion and recurrent pneumonia. Non-smoker. -Order chest X-ray to assess lung condition.  Insomnia Difficulty falling and staying asleep despite use of Trazodone and Valium. -Continue current medication regimen and reassess after sleep study results.  Bruxism Reports of teeth clenching at night leading to jaw soreness. -Recommend over-the-counter mouthpiece for bruxism protection.  Essential Tremor Observable tremor present. -No change in management discussed.  Follow-up Await results of sleep study and chest X-ray. Patient to contact office if no communication received within two weeks  of sleep study.

## 2023-02-04 ENCOUNTER — Ambulatory Visit (INDEPENDENT_AMBULATORY_CARE_PROVIDER_SITE_OTHER): Payer: Medicare Other

## 2023-02-04 ENCOUNTER — Encounter: Payer: Self-pay | Admitting: Internal Medicine

## 2023-02-04 ENCOUNTER — Ambulatory Visit: Payer: Medicare Other | Admitting: Internal Medicine

## 2023-02-04 VITALS — BP 126/76 | HR 90 | Temp 98.1°F | Ht 66.0 in | Wt 204.0 lb

## 2023-02-04 DIAGNOSIS — R053 Chronic cough: Secondary | ICD-10-CM

## 2023-02-04 DIAGNOSIS — J410 Simple chronic bronchitis: Secondary | ICD-10-CM | POA: Diagnosis not present

## 2023-02-04 DIAGNOSIS — G4733 Obstructive sleep apnea (adult) (pediatric): Secondary | ICD-10-CM

## 2023-02-04 DIAGNOSIS — G4763 Sleep related bruxism: Secondary | ICD-10-CM | POA: Diagnosis not present

## 2023-02-04 DIAGNOSIS — R911 Solitary pulmonary nodule: Secondary | ICD-10-CM

## 2023-02-04 NOTE — Patient Instructions (Addendum)
Order- Schedule home sleep test    dx OSA  Please call me 2 weeks after your test for results and recommendations  Order- CXR    dx chronic cough

## 2023-02-11 ENCOUNTER — Telehealth: Payer: Self-pay | Admitting: Internal Medicine

## 2023-02-11 NOTE — Progress Notes (Signed)
I have left a message on their machine to have them return call. No answer at this time.

## 2023-02-11 NOTE — Telephone Encounter (Signed)
PT states someone called him w/Xray results. Please ty again @ [phone]

## 2023-02-13 ENCOUNTER — Encounter: Payer: Self-pay | Admitting: Internal Medicine

## 2023-02-14 ENCOUNTER — Telehealth: Payer: Self-pay | Admitting: Internal Medicine

## 2023-02-14 NOTE — Telephone Encounter (Signed)
PT ret Amy's call for results of xrays. She has tried to call him before. Please try again @ [phone].

## 2023-02-15 NOTE — Telephone Encounter (Signed)
Pt states he would like xray results he had done on 02-04-23. Pt was confused on why he needed a CT scan.  I informed pt of the xray results per Dr Roxy Cedar note from [place], Pt verbalized understanding. NFN., Ct has been scheduled.

## 2023-02-18 NOTE — Telephone Encounter (Signed)
 NFN

## 2023-02-19 DIAGNOSIS — G4733 Obstructive sleep apnea (adult) (pediatric): Secondary | ICD-10-CM

## 2023-03-02 ENCOUNTER — Ambulatory Visit (HOSPITAL_COMMUNITY)
Admission: RE | Admit: 2023-03-02 | Discharge: 2023-03-02 | Disposition: A | Discharge status: Home / Self Care | Payer: Medicare Other | Source: Ambulatory Visit | Attending: Internal Medicine | Admitting: Internal Medicine

## 2023-03-02 DIAGNOSIS — R911 Solitary pulmonary nodule: Secondary | ICD-10-CM | POA: Insufficient documentation

## 2023-03-04 DIAGNOSIS — G4733 Obstructive sleep apnea (adult) (pediatric): Secondary | ICD-10-CM | POA: Diagnosis not present

## 2023-03-10 ENCOUNTER — Telehealth: Payer: Self-pay | Admitting: Internal Medicine

## 2023-03-10 NOTE — Telephone Encounter (Signed)
Patient is calling for results of his CT scan  [phone]   He's also working with [employer] and today was the last day for more information. The CT scan could effect his outcome. He was unaware of the deadline until a couple of days ago.

## 2023-03-11 NOTE — Telephone Encounter (Signed)
Informed patient CT scan has not been read yet.

## 2023-03-14 ENCOUNTER — Other Ambulatory Visit (HOSPITAL_COMMUNITY): Payer: Medicare Other

## 2023-03-17 ENCOUNTER — Telehealth: Payer: Self-pay | Admitting: Internal Medicine

## 2023-03-17 NOTE — Telephone Encounter (Signed)
 Please call Radiology reading room [phone] and ask if they could get this CT read please. It ahs been 2 weeks and he keeps calling us about it. Thanks

## 2023-03-17 NOTE — Telephone Encounter (Signed)
 Patient is calling again about his CT scan results. I notified him that they have still not been reviewed yet. Please contact patient when results are ready.

## 2023-03-17 NOTE — Telephone Encounter (Signed)
 Routing to Dr Maple Hudson  as a fyi to review results when radiology is finished so pt can be informed.   Pt has CT done on 03-02-23. Ct will usually be ready around 2 weeks after they are done.

## 2023-03-18 NOTE — Telephone Encounter (Signed)
 I called reading room. They will try and have the results read today. FYI to Dr Maple Hudson.

## 2023-03-23 ENCOUNTER — Telehealth: Payer: Self-pay

## 2023-03-23 DIAGNOSIS — G4733 Obstructive sleep apnea (adult) (pediatric): Secondary | ICD-10-CM

## 2023-03-23 NOTE — Telephone Encounter (Signed)
 Called patient to review CT chest scan.  Patient verbalized understanding.  Patient wants to know about HST from [facility] 02/19/2023.  Results are in EPIC - scanned on 03/14/2023.  Dr. Maple Hudson, please advise on HST.  Thank you.

## 2023-03-24 NOTE — Telephone Encounter (Signed)
 HST showed obstructive sleep apnea with between 12 and 13 apneas / hour  I think he has been using an old, borrowed machine I recommend we order:  New DME, new CPAP auto 5-20, mask of choice, humidifier, supplies, AirView/ card  He will need a return ov appointment here in 1-3 months per insurance requirements, after getting machine.

## 2023-03-25 NOTE — Telephone Encounter (Signed)
 Left message on VM to review HST, inform of order for new CPAP and schedule return OV per Dr. Maple Hudson.  Will place order for new CPAP.  Patient will need a return OV appointment in 1-3 months.

## 2023-03-28 NOTE — Telephone Encounter (Signed)
 Called patient.  Gave information regarding HST.  Patient will await call from DME to get CPAP and supplies.  Patient states he will call to get follow up appointment with Dr. Maple Hudson once he starts using the CPAP.  Patient verbalized understanding.

## 2023-06-06 NOTE — Progress Notes (Signed)
 02/04/23- 58 yoM never smoker for sleep evaluation courtesy of Kristin Shepperson,PA-C with concern of OSA Medical problem list includes HTN, Erythrocytosis, Epworth score-12 Body weight today-204 lbs -----Snoring, waking up gasping for air, restless sleep, daytime fatigue. ENT surgery - tonsils, septoplasty. Pneumonia- several episodes. Some examiners have heard heart murmur- inconsistent. Difficulty falling asleep, frequent waking, daytime sleepiness, naps. Used Trazodone, valium, carbamazepine . Doesn't think he can sleep now without his CPAP, which he got used from his mother-in-law. Uses Adderall 30 mgXr each morning for alertness. Does nap.   Discussed the use of AI scribe software for clinical note transcription with the patient, who gave verbal consent to proceed.  History of Present Illness   The patient, with a history of sleep apnea, insomnia, and respiratory issues, presents with ongoing struggles with his CPAP machine, from his mother in law,  he has been using for 14 years. The patient reports frequent snoring and gasping for air during sleep, which often leads to waking up. He also reports daytime fatigue and Insomnia with difficulty falling and staying asleep. The patient takes Trazodone 100 mg and Valium 10 mg to aid sleep, carbamazepine ,  and Adderall to combat daytime fatigue.  The patient also reports a persistent cough and a history of pneumonias including Covid.Aaron Aas Despite never having smoked, the patient describes a constant congestion in his lungs and a rattling sound. The patient has had multiple chest x-rays due to these respiratory issues. Last CXR in our system was 2022.  The patient also mentions a tremor and teeth grinding at night, which has been noticed by his dentist. He has not pursued a mouthpiece for this issue due to cost concerns. We discussed otc bruxism guards.   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     Assessment and  Plan:    Obstructive Sleep Apnea Reports of loud snoring, daytime fatigue, and  Insomnia with difficulty falling and staying asleep. History of CPAP use. Medication regimen includes Trazodone 100mg  and Valium 10mg  at night for sleep, and Adderall 30mg  XR in the morning for wakefulness. -Order home sleep study to reassess severity of sleep apnea. -Consider updating CPAP machine and mask if indicated by sleep study results.  Chronic Bronchitis Reports of chronic lung congestion and recurrent pneumonia. Non-smoker. -Order chest X-ray to assess lung condition.  Insomnia Difficulty falling and staying asleep despite use of Trazodone and Valium. -Continue current medication regimen and reassess after sleep study results.  Bruxism Reports of teeth clenching at night leading to jaw soreness. -Recommend over-the-counter mouthpiece for bruxism protection.  Essential Tremor Observable tremor present. -No change in management discussed.  Follow-up Await results of sleep study and chest X-ray. Patient to contact office if no communication received within two weeks of sleep study.    06/07/23- 58 yoM never smoker with hx OSA/ CPAP Medical problem list includes HTN, Erythrocytosis, HST  02/19/23- AHI 12.5, desat to 79%, body weight 205 lbs CPAP auto 5-20/ [employer]  03/25/23 Body weight today-208 lbs Download compliance 93%, AHI 1.5/hr Discussed the use of AI scribe software for clinical note transcription with the patient, who gave verbal consent to proceed.  History of Present Illness   CLOYS VERA is a 58 year old male with sleep apnea who presents for a follow-up on his CPAP therapy.  He is sleeping well with his CPAP machine, which is functioning properly. The machine is set to a pressure range between five and twenty centimeters of water pressure, with  an average around thirteen. He experiences less than two breakthrough apneas per hour and feels rested during the daytime. No issues such as  waking up gasping for air or snoring.  He is managing well with the home care company. He currently uses a full face mask, which seals well and works for him.     Assessment and Plan:    Obstructive Sleep Apnea Obstructive sleep apnea well-controlled with CPAP. Apnea-hypopnea index <2 events/hour. No nocturnal symptoms. Feels well-rested. Current mask effective. - Continue CPAP therapy with pressure settings 5-20 cm H2O. - Contact home care company to assess need for new mask. - Notify provider of any issues with home care company or CPAP equipment.    History of Present Illness  ROS-see HPI  + = positive Constitutional:    weight loss, night sweats, fevers, chills, fatigue, lassitude. HEENT:    +headaches, difficulty swallowing, tooth/dental problems, sore throat,       sneezing, itching, ear ache, +nasal congestion, post nasal drip, snoring CV:    chest pain, orthopnea, PND, swelling in lower extremities, anasarca,                                   , palpitations Resp:   +shortness of breath with exertion or at rest.                +productive cough,   non-productive cough, coughing up of blood.              change in color of mucus.  wheezing.   Skin:    rash or lesions. GI:  No-   heartburn, indigestion, abdominal pain, nausea, vomiting, diarrhea,                 change in bowel habits, loss of appetite GU: dysuria, change in color of urine, no urgency or frequency.   flank pain. MS:  + joint pain, stiffness, decreased range of motion, back pain. Neuro-     nothing unusual Psych:  change in mood or affect.  +depression or +anxiety.   memory loss.  OBJ- Physical Exam General- Alert, Oriented, Affect-appropriate, Distress- none acute Skin- rash-none, lesions- none, excoriation- none Lymphadenopathy- none Head- atraumatic            Eyes- Gross vision intact, PERRLA, conjunctivae and secretions clear            Ears- Hearing, canals-normal            Nose- Clear, no-Septal dev,  mucus, polyps, erosion, perforation             Throat- Mallampati IV , mucosa clear , drainage- none, tonsils- absent, +teeth Neck- flexible , trachea midline, no stridor , thyroid nl, carotid no bruit Chest - symmetrical excursion , unlabored           Heart/CV- RRR , no murmur , no gallop  , no rub, nl s1 s2                           - JVD- none , edema- none, stasis changes- none, varices- none           Lung- clear to P&A, wheeze- none, cough- none , dullness-none, rub- none           Chest wall-  Abd-  Br/ Gen/ Rectal- Not done, not indicated Extrem- + R TKA scar  Neuro- +tremor hands

## 2023-06-07 ENCOUNTER — Ambulatory Visit (INDEPENDENT_AMBULATORY_CARE_PROVIDER_SITE_OTHER): Admitting: Internal Medicine

## 2023-06-07 ENCOUNTER — Encounter: Payer: Self-pay | Admitting: Internal Medicine

## 2023-06-07 VITALS — BP 142/80 | HR 97 | Ht 66.0 in | Wt 208.0 lb

## 2023-06-07 DIAGNOSIS — G4733 Obstructive sleep apnea (adult) (pediatric): Secondary | ICD-10-CM | POA: Diagnosis not present

## 2023-06-07 NOTE — Patient Instructions (Signed)
 We can continue CPAP auto 5-20  Please call if we can help

## 2023-08-05 ENCOUNTER — Encounter (HOSPITAL_COMMUNITY): Payer: Self-pay | Admitting: *Deleted

## 2023-08-05 ENCOUNTER — Emergency Department (HOSPITAL_COMMUNITY)

## 2023-08-05 ENCOUNTER — Emergency Department (HOSPITAL_COMMUNITY)
Admission: EM | Admit: 2023-08-05 | Discharge: 2023-08-05 | Disposition: A | Discharge status: Home / Self Care | Attending: Emergency Medicine | Admitting: Emergency Medicine

## 2023-08-05 ENCOUNTER — Other Ambulatory Visit: Payer: Self-pay

## 2023-08-05 DIAGNOSIS — M5442 Lumbago with sciatica, left side: Secondary | ICD-10-CM | POA: Insufficient documentation

## 2023-08-05 DIAGNOSIS — Z7982 Long term (current) use of aspirin: Secondary | ICD-10-CM | POA: Insufficient documentation

## 2023-08-05 DIAGNOSIS — M545 Low back pain, unspecified: Secondary | ICD-10-CM | POA: Diagnosis present

## 2023-08-05 LAB — CBC WITH DIFFERENTIAL/PLATELET
Abs Immature Granulocytes: 0.01 K/uL (ref 0.00–0.07)
Basophils Absolute: 0 K/uL (ref 0.0–0.1)
Basophils Relative: 0 %
Eosinophils Absolute: 0.1 K/uL (ref 0.0–0.5)
Eosinophils Relative: 1 %
HCT: 47 % (ref 39.0–52.0)
Hemoglobin: 15.9 g/dL (ref 13.0–17.0)
Immature Granulocytes: 0 %
Lymphocytes Relative: 28 %
Lymphs Abs: 2 K/uL (ref 0.7–4.0)
MCH: 31.1 pg (ref 26.0–34.0)
MCHC: 33.8 g/dL (ref 30.0–36.0)
MCV: 92 fL (ref 80.0–100.0)
Monocytes Absolute: 0.9 K/uL (ref 0.1–1.0)
Monocytes Relative: 12 %
Neutro Abs: 4.3 K/uL (ref 1.7–7.7)
Neutrophils Relative %: 59 %
Platelets: 270 K/uL (ref 150–400)
RBC: 5.11 MIL/uL (ref 4.22–5.81)
RDW: 13.1 % (ref 11.5–15.5)
WBC: 7.3 K/uL (ref 4.0–10.5)
nRBC: 0 % (ref 0.0–0.2)

## 2023-08-05 LAB — BASIC METABOLIC PANEL WITH GFR
Anion gap: 9 (ref 5–15)
BUN: 17 mg/dL (ref 6–20)
CO2: 25 mmol/L (ref 22–32)
Calcium: 9.9 mg/dL (ref 8.9–10.3)
Chloride: 102 mmol/L (ref 98–111)
Creatinine, Ser: 1.08 mg/dL (ref 0.61–1.24)
GFR, Estimated: >60 mL/min (ref >60)
Glucose, Bld: 111 mg/dL — ABNORMAL HIGH (ref 70–99)
Potassium: 5.1 mmol/L (ref 3.5–5.1)
Sodium: 136 mmol/L (ref 135–145)

## 2023-08-05 LAB — URINALYSIS, ROUTINE W REFLEX MICROSCOPIC
Bilirubin Urine: NEGATIVE
Glucose, UA: NEGATIVE mg/dL
Hgb urine dipstick: NEGATIVE
Ketones, ur: NEGATIVE mg/dL
Leukocytes,Ua: NEGATIVE
Nitrite: NEGATIVE
Protein, ur: NEGATIVE mg/dL
Specific Gravity, Urine: 1.014 (ref 1.005–1.030)
pH: 7 (ref 5.0–8.0)

## 2023-08-05 MED ORDER — METHYLPREDNISOLONE 4 MG PO TBPK: ORAL_TABLET | ORAL | 0 refills | Status: AC

## 2023-08-05 MED ORDER — KETOROLAC TROMETHAMINE 15 MG/ML IJ SOLN
15.0000 mg | Freq: Once | INTRAMUSCULAR | Status: AC
  Administered 2023-08-05: 15 mg via INTRAMUSCULAR
  Filled 2023-08-05: qty 1

## 2023-08-05 MED ORDER — CYCLOBENZAPRINE HCL 10 MG PO TABS
10.0000 mg | ORAL_TABLET | Freq: Two times a day (BID) | ORAL | 0 refills | Status: AC | PRN
Start: 2023-08-05 — End: ?

## 2023-08-05 MED ORDER — LIDOCAINE 5 % EX PTCH
1.0000 | MEDICATED_PATCH | CUTANEOUS | Status: DC
  Administered 2023-08-05: 1 via TRANSDERMAL
  Filled 2023-08-05: qty 1

## 2023-08-05 NOTE — ED Provider Notes (Signed)
  Physical Exam  BP (!) 145/94 (BP Location: Right Arm)   Pulse 76   Temp 98 F (36.7 C) (Oral)   Resp 20   Ht 5' 6 (1.676 m)   Wt 94.3 kg   SpO2 98%   BMI 33.55 kg/m   Physical Exam  Procedures  Procedures  ED Course / MDM   Clinical Course as of 08/05/23 1721  Fri Aug 05, 2023  1549 Signed out to Dr Patt [RP]    Clinical Course User Index [RP] Yolande Lamar BROCKS, MD   Medical Decision Making Care assumed at 3 PM.  Patient is here with back pain and incontinence.  Patient has a history of back pain and sciatica and had surgery before.  Patient is already on gabapentin and hydrocodone .  Signed out pending MRI of the lumbar spine.  5:22 PM Labs unremarkable.  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  Patient is already on pain medicine.  Will try Medrol Dosepak.  I told him to follow-up with his spine surgeon at [facility].  Problems Addressed: Acute left-sided low back pain with left-sided sciatica: acute illness or injury  Amount and/or Complexity of Data Reviewed Labs: ordered.  Risk Prescription drug management.          Patt Alm Macho, MD 08/05/23 [phone]

## 2023-08-05 NOTE — ED Triage Notes (Signed)
 C/o lower back pain onset 3 weeks ago , states last week he had epidural injection , states the pain goes down his ;eft thigh.

## 2023-08-05 NOTE — Discharge Instructions (Addendum)
 You were seen for your back pain in the emergency department.  You have multilevel impingement of your spine  At home, please use over-the-counter Tylenol , ibuprofen, and lidocaine  patches.  Take the steroids we prescribed you you may also use the cyclobenzaprine we have prescribed you as needed for pain.  Do not take the cyclobenzaprine before driving or operating heavy machinery.  Follow-up with your primary doctor in 2-3 days regarding your visit.  Follow-up with your spine doctor as soon as possible regarding your symptoms.  Return immediately to the emergency department if you experience any of the following: Numbness or weakness of your legs, bowel or bladder incontinence, numbness while wiping after pooping or urinating, or any other concerning symptoms.    Thank you for visiting our Emergency Department. It was a pleasure taking care of you today.

## 2023-08-05 NOTE — ED Provider Notes (Signed)
 [place] EMERGENCY DEPARTMENT AT [facility] Provider Note   CSN: [id number] Arrival date & time: 08/05/23  9048     Patient presents with: Back Pain   Brent Parks is a 58 y.o. male.   58 year old male who presents emergency department back pain.  Patient reports that he was working on a sink 3 weeks ago when he thinks that he strained his back.  Since then has been having pain that radiates down his left leg from his lower back.  Says that it started getting worse and so he went to a spine doctor on 07/28/2023 and had an epidural injection.  Says that initially was doing somewhat better and then started getting worse.  Also has noticed that he started having some urinary incontinence as well.  No fevers or chills.  Not on blood thinners.  No history of back surgery.  Denies any IV drug use or indwelling catheters.  Has been trying Zanaflex for the pain without relief as well as another pain medication that he is unsure of the name.       Prior to Admission medications   Medication Sig Start Date End Date Taking? Authorizing Provider  albuterol  (VENTOLIN  HFA) 108 (90 Base) MCG/ACT inhaler Inhale into the lungs every 6 (six) hours as needed for wheezing or shortness of breath. 01/20/23   [provider]  amphetamine -dextroamphetamine  (ADDERALL XR) 30 MG 24 hr capsule Take 1 capsule (30 mg total) by mouth every morning. 11/05/21     aspirin 81 MG EC tablet Take 81 mg by mouth daily.    [provider]  carbamazepine  (TEGRETOL ) 200 MG tablet Take 800 mg by mouth at bedtime.    [provider]  diazepam (VALIUM) 5 MG tablet Take 2 tablets by mouth at bedtime. 01/11/23   [provider]  escitalopram (LEXAPRO) 20 MG tablet Take 20 mg by mouth 2 (two) times daily.    [provider]  ipratropium-albuterol  (DUONEB) 0.5-2.5 (3) MG/3ML SOLN Inhale 3 mLs into the lungs every 6 (six) hours as needed. 12/09/21   [provider]   lamoTRIgine (LAMICTAL) 200 MG tablet Take 400 mg by mouth daily.     [provider]  Multiple Vitamin (MULTIVITAMIN WITH MINERALS) TABS tablet Take 1 tablet by mouth daily.    [provider]  Omega-3 Fatty Acids (FISH OIL PO) Take 1 tablet by mouth daily.    [provider]  oxymetazoline (AFRIN) 0.05 % nasal spray Place 1 spray into both nostrils 2 (two) times daily as needed for congestion.    [provider]  polyvinyl alcohol (LIQUIFILM TEARS) 1.4 % ophthalmic solution Place 1 drop into both eyes as needed for dry eyes.    [provider]  sildenafil (REVATIO) 20 MG tablet Take 20 mg by mouth. 01/20/23   [provider]  terbinafine  (LAMISIL ) 1 % cream Apply 1 Application topically daily. 01/20/23   [provider]  testosterone cypionate (DEPOTESTOSTERONE CYPIONATE) 200 MG/ML injection Inject 200 mg into the muscle 2 (two) times a week. 01/20/23   [provider]  traZODone (DESYREL) 50 MG tablet Take 50-150 mg by mouth at bedtime. 08/20/19   [provider]    Allergies: Patient has no known allergies.    Review of Systems  Updated Vital Signs BP (!) 155/104   Pulse 87   Temp 97.9 F (36.6 C) (Oral)   Resp 17   Ht 5' 6 (1.676 m)   Wt 94.3  kg   SpO2 100%   BMI 33.55 kg/m   Physical Exam Vitals and nursing note reviewed.  Constitutional:      General: He is not in acute distress.    Appearance: He is well-developed.  HENT:     Head: Normocephalic and atraumatic.     Right Ear: External ear normal.     Left Ear: External ear normal.     Nose: Nose normal.  Eyes:     Extraocular Movements: Extraocular movements intact.     Conjunctiva/sclera: Conjunctivae normal.     Pupils: Pupils are equal, round, and reactive to light.  Musculoskeletal:     Cervical back: Normal range of motion and neck supple.     Comments: No spinal midline TTP in thoracic or lumbar spine. No stepoffs noted.   Motor:  Muscle bulk and tone are normal. Strength is 5/5 in hip flexion, knee flexion and extension, ankle dorsiflexion and plantar flexion bilaterally. Full strength of great toe dorsiflexion bilaterally.  Sensory: Intact sensation to light touch in L2 though S1 dermatomes bilaterally.   Reflexes: Patellar 1+ bilaterally, Achilles 1+ bilaterally, no ankle clonus bilaterally  Skin:    General: Skin is warm and dry.  Neurological:     Mental Status: He is alert. Mental status is at baseline.  Psychiatric:        Mood and Affect: Mood normal.        Behavior: Behavior normal.     (all labs ordered are listed, but only abnormal results are displayed) Labs Reviewed  BASIC METABOLIC PANEL WITH GFR - Abnormal; Notable for the following components:      Result Value   Glucose, Bld 111 (*)    All other components within normal limits  CBC WITH DIFFERENTIAL/PLATELET  URINALYSIS, ROUTINE W REFLEX MICROSCOPIC    EKG: None  Radiology: No results found.   Procedures   Medications Ordered in the ED  ketorolac (TORADOL) 15 MG/ML injection 15 mg (has no administration in time range)  lidocaine  (LIDODERM ) 5 % 1 patch (has no administration in time range)    Clinical Course as of 08/05/23 1550  Fri Aug 05, 2023  1549 Signed out to Dr Patt [RP]    Clinical Course User Index [RP] Yolande Lamar BROCKS, MD                                 Medical Decision Making Amount and/or Complexity of Data Reviewed Labs: ordered.  Risk Prescription drug management.   58 year old male who presents emergency department with back pain  Initial Ddx:  Cauda equina, lumbar radiculopathy, spinal epidural hematoma, spinal dural abscess, fracture  MDM/Course:  Patient presents emergency department 3 weeks of back pain.  Unfortunately started developing some urinary incontinence as well.  On exam does not have any focal neurologic deficits but does have 1+ patellar reflexes which he says he thinks is chronic  because of knee injuries.  Lab work unremarkable.  Urinalysis not consistent with UTI  and does not have any RBCs concerning for possible kidney stone that would be causing his symptoms.  MRI was ordered from triage and we are waiting to read at this point in time.  Not on AC.  Not having any infectious symptoms at this point in time.  Signed out to the oncoming physician awaiting results of MRI.  This patient presents to the ED for concern of complaints listed in HPI, this  involves an extensive number of treatment options, and is a complaint that carries with it a high risk of complications and morbidity. Disposition including potential need for admission considered.   Dispo: Pending remainder of workup  Records reviewed Outpatient Clinic Notes The following labs were independently interpreted: Urinalysis and show no acute abnormality I independently reviewed the following imaging with scope of interpretation limited to determining acute life  I have reviewed the patients home medications and made adjustments as needed  Portions of this note were generated with [employer] dictation software. Dictation errors may occur despite best attempts at proofreading.     Final diagnoses:  Acute left-sided low back pain with left-sided sciatica    ED Discharge Orders     None          Yolande Lamar BROCKS, MD 08/05/23 1550

## 2023-08-05 NOTE — ED Provider Triage Note (Signed)
 Emergency Medicine Provider Triage Evaluation Note  Brent Parks , a 58 y.o. male  was evaluated in triage.  Pt complains of back pain. Hx of bulging discs and follows with neurosurgery and pain management. Had injections in his back about 1 week ago. He states the last two days has had 9/10 back pain and numbness down his left leg, difficulty walking, and urinary incontinence.   Review of Systems  Positive: Back pain, numbness Negative: Constipation, fecal incontinence   Physical Exam  BP (!) 155/104   Pulse 87   Temp 97.9 F (36.6 C) (Oral)   Resp 17   SpO2 100%  Gen:   Awake, no distress   Resp:  Normal effort  MSK:   Moves extremities without difficulty  Neuro:  No saddle anesthesia, MS is 4/5 in LLE and 5/5 in RLE   Medical Decision Making  Medically screening exam initiated at 10:18 AM.  Appropriate orders placed.  Brent Parks was informed that the remainder of the evaluation will be completed by another provider, this initial triage assessment does not replace that evaluation, and the importance of remaining in the ED until their evaluation is complete.  [address]   Gennaro, Marisela Line L, DO 08/05/23 1019

## 2023-11-22 ENCOUNTER — Telehealth: Payer: Self-pay

## 2023-11-22 NOTE — Telephone Encounter (Signed)
 Copied from CRM [id number]. Topic: Appointments - Scheduling Inquiry for Clinic >> Nov 21, 2023  4:35 PM Corin V wrote: Reason for CRM: Patient is wanting to see Nola Angles as his wife is a current patient of him. Please advise if Nola Angles is willing to take patient on or if he needs to schedule with an alternate provider. Callback: [phone]

## 2024-01-05 ENCOUNTER — Ambulatory Visit: Admitting: Physician Assistant

## 2024-01-05 VITALS — BP 142/88 | HR 78 | Temp 97.8°F | Ht 66.0 in | Wt 203.0 lb

## 2024-01-05 DIAGNOSIS — G4733 Obstructive sleep apnea (adult) (pediatric): Secondary | ICD-10-CM

## 2024-01-05 DIAGNOSIS — F902 Attention-deficit hyperactivity disorder, combined type: Secondary | ICD-10-CM

## 2024-01-05 DIAGNOSIS — Z6832 Body mass index (BMI) 32.0-32.9, adult: Secondary | ICD-10-CM | POA: Diagnosis not present

## 2024-01-05 DIAGNOSIS — E66811 Obesity, class 1: Secondary | ICD-10-CM

## 2024-01-05 DIAGNOSIS — E6609 Other obesity due to excess calories: Secondary | ICD-10-CM

## 2024-01-05 DIAGNOSIS — F5105 Insomnia due to other mental disorder: Secondary | ICD-10-CM | POA: Diagnosis not present

## 2024-01-05 DIAGNOSIS — E049 Nontoxic goiter, unspecified: Secondary | ICD-10-CM

## 2024-01-05 DIAGNOSIS — J111 Influenza due to unidentified influenza virus with other respiratory manifestations: Secondary | ICD-10-CM

## 2024-01-05 DIAGNOSIS — E291 Testicular hypofunction: Secondary | ICD-10-CM

## 2024-01-05 DIAGNOSIS — R7301 Impaired fasting glucose: Secondary | ICD-10-CM

## 2024-01-05 DIAGNOSIS — F99 Mental disorder, not otherwise specified: Secondary | ICD-10-CM

## 2024-01-05 DIAGNOSIS — F4323 Adjustment disorder with mixed anxiety and depressed mood: Secondary | ICD-10-CM

## 2024-01-05 MED ORDER — TRAZODONE HCL 50 MG PO TABS: 50.0000 mg | ORAL_TABLET | Freq: Every day | ORAL | 3 refills | Status: AC

## 2024-01-05 MED ORDER — AMPHETAMINE-DEXTROAMPHET ER 30 MG PO CP24: 30.0000 mg | ORAL_CAPSULE | Freq: Every morning | ORAL | 0 refills | Status: AC

## 2024-01-05 MED ORDER — DIAZEPAM 5 MG PO TABS: 10.0000 mg | ORAL_TABLET | Freq: Every day | ORAL | 0 refills | Status: DC

## 2024-01-05 MED ORDER — TESTOSTERONE CYPIONATE 200 MG/ML IM SOLN: 50.0000 mg | INTRAMUSCULAR | 0 refills | Status: AC
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
## 2024-01-06 LAB — LIPID PANEL
Chol/HDL Ratio: 3.3 ratio (ref 0.0–5.0)
Cholesterol, Total: 161 mg/dL (ref 100–199)
HDL: 49 mg/dL
LDL Chol Calc (NIH): 95 mg/dL (ref 0–99)
Triglycerides: 92 mg/dL (ref 0–149)
VLDL Cholesterol Cal: 17 mg/dL (ref 5–40)

## 2024-01-06 LAB — COMPREHENSIVE METABOLIC PANEL WITH GFR
ALT: 52 IU/L — ABNORMAL HIGH (ref 0–44)
AST: 43 IU/L — ABNORMAL HIGH (ref 0–40)
Albumin: 4.3 g/dL (ref 3.8–4.9)
Alkaline Phosphatase: 115 IU/L (ref 47–123)
BUN/Creatinine Ratio: 14 (ref 9–20)
BUN: 14 mg/dL (ref 6–24)
Bilirubin Total: 0.2 mg/dL (ref 0.0–1.2)
CO2: 25 mmol/L (ref 20–29)
Calcium: 9.2 mg/dL (ref 8.7–10.2)
Chloride: 98 mmol/L (ref 96–106)
Creatinine, Ser: 1 mg/dL (ref 0.76–1.27)
Globulin, Total: 3.4 g/dL (ref 1.5–4.5)
Glucose: 107 mg/dL — ABNORMAL HIGH (ref 70–99)
Potassium: 4.5 mmol/L (ref 3.5–5.2)
Sodium: 139 mmol/L (ref 134–144)
Total Protein: 7.7 g/dL (ref 6.0–8.5)
eGFR: 87 mL/min/1.73

## 2024-01-06 LAB — CBC WITH DIFFERENTIAL/PLATELET
Basophils Absolute: 0 x10E3/uL (ref 0.0–0.2)
Basos: 0 %
EOS (ABSOLUTE): 0 x10E3/uL (ref 0.0–0.4)
Eos: 0 %
Hematocrit: 49.7 % (ref 37.5–51.0)
Hemoglobin: 16.9 g/dL (ref 13.0–17.7)
Immature Grans (Abs): 0 x10E3/uL (ref 0.0–0.1)
Immature Granulocytes: 0 %
Lymphocytes Absolute: 1.2 x10E3/uL (ref 0.7–3.1)
Lymphs: 19 %
MCH: 30.3 pg (ref 26.6–33.0)
MCHC: 34 g/dL (ref 31.5–35.7)
MCV: 89 fL (ref 79–97)
Monocytes Absolute: 0.5 x10E3/uL (ref 0.1–0.9)
Monocytes: 7 %
Neutrophils Absolute: 4.8 x10E3/uL (ref 1.4–7.0)
Neutrophils: 74 %
Platelets: 285 x10E3/uL (ref 150–450)
RBC: 5.57 x10E6/uL (ref 4.14–5.80)
RDW: 13.2 % (ref 11.6–15.4)
WBC: 6.5 x10E3/uL (ref 3.4–10.8)

## 2024-01-06 LAB — TESTOSTERONE,FREE AND TOTAL
Testosterone, Free: 0.7 pg/mL — ABNORMAL LOW (ref 7.2–24.0)
Testosterone: 115 ng/dL — ABNORMAL LOW (ref 264–916)

## 2024-01-06 LAB — HEMOGLOBIN A1C
Est. average glucose Bld gHb Est-mCnc: 120 mg/dL
Hgb A1c MFr Bld: 5.8 % — ABNORMAL HIGH (ref 4.8–5.6)

## 2024-01-09 ENCOUNTER — Ambulatory Visit: Payer: Self-pay

## 2024-01-09 NOTE — Telephone Encounter (Signed)
 FYI Only or Action Required?: FYI only for provider: appointment scheduled on 01/10/24.  Patient was last seen in primary care on 01/05/2024 by Milon Cleaves, PA.  Called Nurse Triage reporting Cough.  Symptoms began about a month ago.  Interventions attempted: Nothing.  Symptoms are: unchanged.  Triage Disposition: See Physician Within 24 Hours  Patient/caregiver understands and will follow disposition?:      Reason for Disposition  SEVERE coughing spells (e.g., whooping sound after coughing, vomiting after coughing)  [1] Single large node AND [2] size > 1 inch (2.5 cm) AND [3] no fever  Answer Assessment - Initial Assessment Questions 1. ONSET: When did the cough begin?      A month ago 2. SEVERITY: How bad is the cough today?      Has coughing spells, difficult to catch breath when coughing 3. SPUTUM: Describe the color of your sputum (e.g., none, dry cough; clear, white, yellow, green)     denies 4. HEMOPTYSIS: Are you coughing up any blood? If Yes, ask: How much? (e.g., flecks, streaks, tablespoons, etc.)     unknown 5. DIFFICULTY BREATHING: Are you having difficulty breathing? If Yes, ask: How bad is it? (e.g., mild, moderate, severe)      With cough only 6. FEVER: Do you have a fever? If Yes, ask: What is your temperature, how was it measured, and when did it start?     denies 7. CARDIAC HISTORY: Do you have any history of heart disease? (e.g., heart attack, congestive heart failure)      no 8. LUNG HISTORY: Do you have any history of lung disease?  (e.g., pulmonary embolus, asthma, emphysema)      9. PE RISK FACTORS: Do you have a history of blood clots? (or: recent major surgery, recent prolonged travel, bedridden)      10. OTHER SYMPTOMS: Do you have any other symptoms? (e.g., runny nose, wheezing, chest pain) Rt neck swollen lymph node, unable to describe size states you can see it, moving head normally  Protocols used: Cough - Acute  Non-Productive-A-AH, Lymph Nodes - Swollen-A-AH   Chest pain and difficulty breathing Communication Red Word that prompted transfer to Nurse Triage: Patient experiencing chest pain and difficulty breathing. Patient was seen by PCP on 12/18 and was advised to call back and symptoms worsened.

## 2024-01-10 ENCOUNTER — Encounter: Payer: Self-pay | Admitting: Physician Assistant

## 2024-01-10 ENCOUNTER — Ambulatory Visit: Admitting: Physician Assistant

## 2024-01-10 ENCOUNTER — Ambulatory Visit (HOSPITAL_BASED_OUTPATIENT_CLINIC_OR_DEPARTMENT_OTHER)
Admission: RE | Admit: 2024-01-10 | Discharge: 2024-01-10 | Disposition: A | Discharge status: Home / Self Care | Source: Ambulatory Visit | Attending: Physician Assistant | Admitting: Physician Assistant

## 2024-01-10 VITALS — BP 132/90 | HR 91 | Temp 97.2°F | Ht 66.0 in | Wt 198.6 lb

## 2024-01-10 DIAGNOSIS — R053 Chronic cough: Secondary | ICD-10-CM

## 2024-01-10 DIAGNOSIS — J984 Other disorders of lung: Secondary | ICD-10-CM

## 2024-01-10 DIAGNOSIS — R251 Tremor, unspecified: Secondary | ICD-10-CM

## 2024-01-10 DIAGNOSIS — R0602 Shortness of breath: Secondary | ICD-10-CM | POA: Diagnosis not present

## 2024-01-10 MED ORDER — DOXYCYCLINE HYCLATE 100 MG PO TABS: 100.0000 mg | ORAL_TABLET | Freq: Two times a day (BID) | ORAL | 0 refills | Status: AC

## 2024-01-10 MED ORDER — PREDNISONE 20 MG PO TABS: ORAL_TABLET | ORAL | 0 refills | Status: AC

## 2024-01-10 MED ORDER — IPRATROPIUM-ALBUTEROL 0.5-2.5 (3) MG/3ML IN SOLN: 3.0000 mL | Freq: Four times a day (QID) | RESPIRATORY_TRACT | 2 refills | Status: AC | PRN

## 2024-01-10 MED ORDER — TRIAMCINOLONE ACETONIDE 40 MG/ML IJ SUSP: 80.0000 mg | Freq: Once | INTRAMUSCULAR | Status: AC

## 2024-01-10 MED ORDER — AIRSUPRA 90-80 MCG/ACT IN AERO: 2.0000 | INHALATION_SPRAY | Freq: Every day | RESPIRATORY_TRACT | 3 refills | Status: AC | PRN

## 2024-01-10 MED ORDER — ALBUTEROL SULFATE HFA 108 (90 BASE) MCG/ACT IN AERS: 1.0000 | INHALATION_SPRAY | Freq: Four times a day (QID) | RESPIRATORY_TRACT | 3 refills | Status: AC | PRN

## 2024-01-10 NOTE — Progress Notes (Signed)
 "  Acute Office Visit  Subjective:    Patient ID: Brent Parks, male    DOB: November 13, 1965, 58 y.o.   MRN: [id number]  Chief Complaint  Patient presents with   On-going cough    HPI: Patient is in today for worsening cough  Discussed the use of AI scribe software for clinical note transcription with the patient, who gave verbal consent to proceed.  History of Present Illness Brent Parks is a 58 year old male with a history of lung sensitivity who presents with respiratory symptoms likely related to pigeon exposure.  He experiences respiratory symptoms, including coughing and wheezing, which he attributes to exposure to pigeon dander and droppings. His symptoms improve when away from the pigeons, such as during a trip to the beach, but worsen upon returning home. He describes his lungs as 'rattling' and feels they are 'empty but raw.' He experiences significant coughing, which he feels might lead to rib pain.  He has a history of severe pneumonia with lung scarring and has worked around asbestos, which may have contributed to his lung issues. He uses an albuterol  inhaler and a DuoNeb nebulizer for his symptoms but requires a refill for the inhaler. He has not used oral prednisone  before.  He reports a tremor that affects his fine motor skills, such as writing and eating, which he describes as a 'whiskey tremor.' He has not been diagnosed with Parkinson's disease.  He mentions having fluid behind both eardrums, which he attributes to pressure from coughing. He also reports a tender area in his throat, likely from excessive coughing.  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 He has never smoked and advocates against smoking.       Past Medical History:  Diagnosis Date   Anxiety    Arthritis    Asthma    Blood dyscrasia    erythrocytosis   COPD (chronic obstructive pulmonary disease) (HCC)    Depression    Enlarged thyroid 01/13/2024   Erythrocytosis     Heart murmur    Hypertension    Neuromuscular disorder (HCC)    Obesity    Seizures (HCC)    Sleep apnea     Past Surgical History:  Procedure Laterality Date   FRACTURE SURGERY     TENDON MANIPULATION Right 11/2019    Family History  Problem Relation Age of Onset   COPD Mother     Social History   Socioeconomic History   Marital status: Married    Spouse name: Not on file   Number of children: 2   Years of education: Not on file   Highest education level: Some college, no degree  Occupational History   Not on file  Tobacco Use   Smoking status: Never   Smokeless tobacco: Never  Vaping Use   Vaping status: Never Used  Substance and Sexual Activity   Alcohol use: Not Currently   Drug use: Never   Sexual activity: Yes    Birth control/protection: None  Other Topics Concern   Not on file  Social History Narrative   Not on file   Social Drivers of Health   Tobacco Use: Low Risk (01/10/2024)   Patient History    Smoking Tobacco Use: Never    Smokeless Tobacco Use: Never    Passive Exposure: Not on file  Financial Resource Strain: Low Risk (01/05/2024)   Overall Financial Resource Strain (CARDIA)    Difficulty of Paying Living Expenses: Not very hard  Food Insecurity: Food Insecurity Present (01/05/2024)   Epic    Worried About Programme Researcher, Broadcasting/film/video in the Last Year: Sometimes true    Ran Out of Food in the Last Year: Sometimes true  Transportation Needs: No Transportation Needs (01/05/2024)   Epic    Lack of Transportation (Medical): No    Lack of Transportation (Non-Medical): No  Physical Activity: Inactive (01/05/2024)   Exercise Vital Sign    Days of Exercise per Week: 0 days    Minutes of Exercise per Session: Not on file  Stress: Stress Concern Present (01/05/2024)   [employer] of Occupational Health - Occupational Stress Questionnaire    Feeling of Stress: Very much  Social Connections: Moderately Isolated (01/05/2024)   Social Connection  and Isolation Panel    Frequency of Communication with Friends and Family: More than three times a week    Frequency of Social Gatherings with Friends and Family: Once a week    Attends Religious Services: Never    Database Administrator or Organizations: No    Attends Engineer, Structural: Not on file    Marital Status: Married  Catering Manager Violence: Not At Risk (08/10/2023)   Received from [facility]   HITS    Over the last 12 months how often did your partner physically hurt you?: Never    Over the last 12 months how often did your partner insult you or talk down to you?: Never    Over the last 12 months how often did your partner threaten you with physical harm?: Never    Over the last 12 months how often did your partner scream or curse at you?: Never  Depression (PHQ2-9): High Risk (01/05/2024)   Depression (PHQ2-9)    PHQ-2 Score: 11  Alcohol Screen: Low Risk (01/05/2024)   Alcohol Screen    Last Alcohol Screening Score (AUDIT): 0  Housing: Unknown (01/05/2024)   Epic    Unable to Pay for Housing in the Last Year: No    Number of Times Moved in the Last Year: Not on file    Homeless in the Last Year: No  Utilities: Not At Risk (03/17/2023)   Received from [facility] Utilities    Threatened with loss of utilities: No  Health Literacy: Not on file    Outpatient Medications Prior to Visit  Medication Sig Dispense Refill   amphetamine -dextroamphetamine  (ADDERALL XR) 30 MG 24 hr capsule Take 1 capsule (30 mg total) by mouth every morning. 30 capsule 0   aspirin 81 MG EC tablet Take 81 mg by mouth daily.     carbamazepine  (TEGRETOL ) 200 MG tablet Take 800 mg by mouth at bedtime.     cyclobenzaprine  (FLEXERIL ) 10 MG tablet Take 1 tablet (10 mg total) by mouth 2 (two) times daily as needed for muscle spasms. 20 tablet 0   diazepam  (VALIUM ) 5 MG tablet Take 2 tablets (10 mg total) by mouth at bedtime. 30 tablet 0   escitalopram (LEXAPRO) 20 MG tablet Take  20 mg by mouth 2 (two) times daily.     lamoTRIgine (LAMICTAL) 200 MG tablet Take 400 mg by mouth daily.      methylPREDNISolone  (MEDROL  DOSEPAK) 4 MG TBPK tablet Take as directed on the packaging 21 each 0   Multiple Vitamin (MULTIVITAMIN WITH MINERALS) TABS tablet Take 1 tablet by mouth daily.     Omega-3 Fatty Acids (FISH OIL PO) Take 1 tablet by mouth daily.  oxymetazoline (AFRIN) 0.05 % nasal spray Place 1 spray into both nostrils 2 (two) times daily as needed for congestion.     polyvinyl alcohol (LIQUIFILM TEARS) 1.4 % ophthalmic solution Place 1 drop into both eyes as needed for dry eyes.     sildenafil (REVATIO) 20 MG tablet Take 20 mg by mouth.     terbinafine  (LAMISIL ) 1 % cream Apply 1 Application topically daily.     testosterone  cypionate (DEPOTESTOSTERONE CYPIONATE) 200 MG/ML injection Inject 0.25 mLs (50 mg total) into the muscle 2 (two) times a week. 2 mL 0   traZODone  (DESYREL ) 50 MG tablet Take 1 tablet (50 mg total) by mouth at bedtime. 90 tablet 3   albuterol  (VENTOLIN  HFA) 108 (90 Base) MCG/ACT inhaler Inhale into the lungs every 6 (six) hours as needed for wheezing or shortness of breath.     ipratropium-albuterol  (DUONEB) 0.5-2.5 (3) MG/3ML SOLN Inhale 3 mLs into the lungs every 6 (six) hours as needed.     azithromycin (ZITHROMAX) 250 MG tablet Take 250 mg by mouth daily.     benzonatate (TESSALON) 100 MG capsule Take 100 mg by mouth 3 (three) times daily as needed for cough.     No facility-administered medications prior to visit.    Allergies[1]  Review of Systems  Constitutional:  Negative for appetite change, fatigue and fever.  HENT:  Negative for congestion, ear pain, sinus pressure and sore throat.   Respiratory:  Positive for cough and wheezing. Negative for chest tightness and shortness of breath.   Cardiovascular:  Negative for chest pain and palpitations.  Gastrointestinal:  Negative for abdominal pain, constipation, diarrhea, nausea and vomiting.   Genitourinary:  Negative for dysuria and hematuria.  Musculoskeletal:  Negative for arthralgias, back pain, joint swelling and myalgias.  Skin:  Negative for rash.  Neurological:  Negative for dizziness, weakness and headaches.  Psychiatric/Behavioral:  Negative for dysphoric mood. The patient is not nervous/anxious.        Objective:        01/10/2024    9:46 AM 01/05/2024    2:06 PM 08/05/2023    4:00 PM  Vitals with BMI  Height 5' 6 5' 6   Weight 198 lbs 10 oz 203 lbs   BMI 32.07 32.78   Systolic 132 142 854  Diastolic 90 88 94  Pulse 91 78 76    No data found.    Physical Exam Vitals reviewed.  Constitutional:      Appearance: Normal appearance.  Neck:     Vascular: No carotid bruit.  Cardiovascular:     Rate and Rhythm: Normal rate and regular rhythm.     Heart sounds: Normal heart sounds.  Pulmonary:     Effort: Pulmonary effort is normal.     Breath sounds: Stridor present. Wheezing and rhonchi present.  Abdominal:     General: Bowel sounds are normal.     Palpations: Abdomen is soft.     Tenderness: There is no abdominal tenderness.  Neurological:     Mental Status: He is alert and oriented to person, place, and time.  Psychiatric:        Mood and Affect: Mood normal.        Behavior: Behavior normal.     Health Maintenance Due  Topic Date Due   HIV Screening  Never done   Hepatitis C Screening  Never done   Pneumococcal Vaccine: 50+ Years (1 of 2 - PCV) Never done   Hepatitis B Vaccines 19-59  Average Risk (1 of 3 - 19+ 3-dose series) Never done   COVID-19 Vaccine (3 - Moderna risk series) 06/07/2019   Zoster Vaccines- Shingrix (2 of 2) 09/11/2020   Influenza Vaccine  08/19/2023       Topic Date Due   Hepatitis B Vaccines 19-59 Average Risk (1 of 3 - 19+ 3-dose series) Never done     No results found for: TSH Lab Results  Component Value Date   WBC 6.5 01/05/2024   HGB 16.9 01/05/2024   HCT 49.7 01/05/2024   MCV 89 01/05/2024    PLT 285 01/05/2024   Lab Results  Component Value Date   NA 139 01/05/2024   K 4.5 01/05/2024   CO2 25 01/05/2024   GLUCOSE 107 (H) 01/05/2024   BUN 14 01/05/2024   CREATININE 1.00 01/05/2024   BILITOT 0.2 01/05/2024   ALKPHOS 115 01/05/2024   AST 43 (H) 01/05/2024   ALT 52 (H) 01/05/2024   PROT 7.7 01/05/2024   ALBUMIN 4.3 01/05/2024   CALCIUM 9.2 01/05/2024   ANIONGAP 9 08/05/2023   EGFR 87 01/05/2024   Lab Results  Component Value Date   CHOL 161 01/05/2024   Lab Results  Component Value Date   HDL 49 01/05/2024   Lab Results  Component Value Date   LDLCALC 95 01/05/2024   Lab Results  Component Value Date   TRIG 92 01/05/2024   Lab Results  Component Value Date   CHOLHDL 3.3 01/05/2024   Lab Results  Component Value Date   HGBA1C 5.8 (H) 01/05/2024        Results for orders placed or performed in visit on 01/05/24  CBC with Differential/Platelet   Collection Time: 01/05/24  3:12 PM  Result Value Ref Range   WBC 6.5 3.4 - 10.8 x10E3/uL   RBC 5.57 4.14 - 5.80 x10E6/uL   Hemoglobin 16.9 13.0 - 17.7 g/dL   Hematocrit 50.2 62.4 - 51.0 %   MCV 89 79 - 97 fL   MCH 30.3 26.6 - 33.0 pg   MCHC 34.0 31.5 - 35.7 g/dL   RDW 86.7 88.3 - 84.5 %   Platelets 285 150 - 450 x10E3/uL   Neutrophils 74 Not Estab. %   Lymphs 19 Not Estab. %   Monocytes 7 Not Estab. %   Eos 0 Not Estab. %   Basos 0 Not Estab. %   Neutrophils Absolute 4.8 1.4 - 7.0 x10E3/uL   Lymphocytes Absolute 1.2 0.7 - 3.1 x10E3/uL   Monocytes Absolute 0.5 0.1 - 0.9 x10E3/uL   EOS (ABSOLUTE) 0.0 0.0 - 0.4 x10E3/uL   Basophils Absolute 0.0 0.0 - 0.2 x10E3/uL   Immature Granulocytes 0 Not Estab. %   Immature Grans (Abs) 0.0 0.0 - 0.1 x10E3/uL  Comprehensive metabolic panel with GFR   Collection Time: 01/05/24  3:12 PM  Result Value Ref Range   Glucose 107 (H) 70 - 99 mg/dL   BUN 14 6 - 24 mg/dL   Creatinine, Ser 8.99 0.76 - 1.27 mg/dL   eGFR 87 >40 fO/fpw/8.26   BUN/Creatinine Ratio 14 9 -  20   Sodium 139 134 - 144 mmol/L   Potassium 4.5 3.5 - 5.2 mmol/L   Chloride 98 96 - 106 mmol/L   CO2 25 20 - 29 mmol/L   Calcium 9.2 8.7 - 10.2 mg/dL   Total Protein 7.7 6.0 - 8.5 g/dL   Albumin 4.3 3.8 - 4.9 g/dL   Globulin, Total 3.4 1.5 - 4.5 g/dL  Bilirubin Total 0.2 0.0 - 1.2 mg/dL   Alkaline Phosphatase 115 47 - 123 IU/L   AST 43 (H) 0 - 40 IU/L   ALT 52 (H) 0 - 44 IU/L  Hemoglobin A1c   Collection Time: 01/05/24  3:12 PM  Result Value Ref Range   Hgb A1c MFr Bld 5.8 (H) 4.8 - 5.6 %   Est. average glucose Bld gHb Est-mCnc 120 mg/dL  Lipid panel   Collection Time: 01/05/24  3:12 PM  Result Value Ref Range   Cholesterol, Total 161 100 - 199 mg/dL   Triglycerides 92 0 - 149 mg/dL   HDL 49 >60 mg/dL   VLDL Cholesterol Cal 17 5 - 40 mg/dL   LDL Chol Calc (NIH) 95 0 - 99 mg/dL   Chol/HDL Ratio 3.3 0.0 - 5.0 ratio  Testosterone ,Free and Total   Collection Time: 01/05/24  3:12 PM  Result Value Ref Range   Testosterone  115 (L) 264 - 916 ng/dL   Testosterone , Free 0.7 (L) 7.2 - 24.0 pg/mL     Assessment & Plan:   Assessment & Plan Chronic cough Chronic hypersensitivity pneumonitis with chronic cough Chronic hypersensitivity pneumonitis likely exacerbated by pigeon dander and dust exposure. Differential includes bacterial infection. Discussed prednisone  side effects. - Administered steroid injection. - Prescribed oral prednisone  taper. - Ordered chest x-ray at [facility] on [address]. - Prescribed DuoNeb nebulizer for morning use. - Prescribed albuterol  inhaler for use as needed, four hours after DuoNeb. - Prescribed Air Supra inhaler for chronic inflammation management. - Advised to rinse mouth after inhaler use. Orders:   doxycycline  (VIBRA -TABS) 100 MG tablet; Take 1 tablet (100 mg total) by mouth 2 (two) times daily.   predniSONE  (DELTASONE ) 20 MG tablet; Take 3 tablets (60 mg total) by mouth daily with breakfast for 3 days, THEN 2 tablets (40 mg total)  daily with breakfast for 3 days, THEN 1 tablet (20 mg total) daily with breakfast for 3 days.   triamcinolone  acetonide (KENALOG -40) injection 80 mg   DG Chest 2 View; Future  Chronic lung disease Chronic hypersensitivity pneumonitis likely exacerbated by pigeon dander and dust exposure. Differential includes bacterial infection. Discussed prednisone  side effects. - Administered steroid injection. - Prescribed oral prednisone  taper. - Ordered chest x-ray at [facility] on [address]. - Prescribed DuoNeb nebulizer for morning use. - Prescribed albuterol  inhaler for use as needed, four hours after DuoNeb. - Prescribed Air Supra inhaler for chronic inflammation management. - Advised to rinse mouth after inhaler use. Orders:   ipratropium-albuterol  (DUONEB) 0.5-2.5 (3) MG/3ML SOLN; Inhale 3 mLs into the lungs every 6 (six) hours as needed.   albuterol  (VENTOLIN  HFA) 108 (90 Base) MCG/ACT inhaler; Inhale 1-2 puffs into the lungs every 6 (six) hours as needed for wheezing or shortness of breath.   Albuterol -Budesonide (AIRSUPRA ) 90-80 MCG/ACT AERO; Inhale 2 puffs into the lungs daily as needed.   triamcinolone  acetonide (KENALOG -40) injection 80 mg   DG Chest 2 View; Future  Tremor Tremor affects fine motor skills. Discussed potential side effects of prednisone  and beta blockers for management. - Discussed potential use of beta blockers for tremor management. - Advised on alcohol use to relax fine motor skills, with caution regarding consumption.      Body mass index is 32.05 kg/m..   Meds ordered this encounter  Medications   doxycycline  (VIBRA -TABS) 100 MG tablet    Sig: Take 1 tablet (100 mg total) by mouth 2 (two) times daily.  Dispense:  14 tablet    Refill:  0   predniSONE  (DELTASONE ) 20 MG tablet    Sig: Take 3 tablets (60 mg total) by mouth daily with breakfast for 3 days, THEN 2 tablets (40 mg total) daily with breakfast for 3 days, THEN 1 tablet (20 mg total)  daily with breakfast for 3 days.    Dispense:  18 tablet    Refill:  0   ipratropium-albuterol  (DUONEB) 0.5-2.5 (3) MG/3ML SOLN    Sig: Inhale 3 mLs into the lungs every 6 (six) hours as needed.    Dispense:  360 mL    Refill:  2   albuterol  (VENTOLIN  HFA) 108 (90 Base) MCG/ACT inhaler    Sig: Inhale 1-2 puffs into the lungs every 6 (six) hours as needed for wheezing or shortness of breath.    Dispense:  8 g    Refill:  3   Albuterol -Budesonide (AIRSUPRA ) 90-80 MCG/ACT AERO    Sig: Inhale 2 puffs into the lungs daily as needed.    Dispense:  10.7 g    Refill:  3   triamcinolone  acetonide (KENALOG -40) injection 80 mg    Orders Placed This Encounter  Procedures   DG Chest 2 View     Follow-up: No follow-ups on file.  An After Visit Summary was printed and given to the patient.   I,Lauren M Auman,acting as a neurosurgeon for [employer], PA.,have documented all relevant documentation on the behalf of Nola Angles, PA,as directed by  Nola Angles, PA while in the presence of Nola Angles, [place].    LILLETTE Kato I Leal-Borjas,acting as a scribe for Nola Angles, PA.,have documented all relevant documentation on the behalf of Nola Angles, PA,as directed by  Nola Angles, PA while in the presence of Nola Angles, [place].    Nola Angles, [place] [facility] [phone]     [1] No Known Allergies  "

## 2024-01-11 ENCOUNTER — Ambulatory Visit: Payer: Self-pay | Admitting: Family Medicine

## 2024-01-11 ENCOUNTER — Ambulatory Visit: Payer: Self-pay | Admitting: Physician Assistant

## 2024-01-11 DIAGNOSIS — E291 Testicular hypofunction: Secondary | ICD-10-CM

## 2024-01-13 ENCOUNTER — Encounter: Payer: Self-pay | Admitting: Physician Assistant

## 2024-01-13 DIAGNOSIS — J111 Influenza due to unidentified influenza virus with other respiratory manifestations: Secondary | ICD-10-CM | POA: Insufficient documentation

## 2024-01-13 DIAGNOSIS — E049 Nontoxic goiter, unspecified: Secondary | ICD-10-CM | POA: Insufficient documentation

## 2024-01-13 DIAGNOSIS — E291 Testicular hypofunction: Secondary | ICD-10-CM | POA: Insufficient documentation

## 2024-01-13 NOTE — Assessment & Plan Note (Signed)
 Anxiety and depression Managed with Adderall, trazodone , and diazepam . Effective but sleep disturbances persist due to anxiety. - Refill prescriptions for Adderall, trazodone , and diazepam . - Discuss sleep hygiene and strategies to manage racing thoughts.

## 2024-01-13 NOTE — Assessment & Plan Note (Signed)
 Managed with Adderall, trazodone , and diazepam . Effective but sleep disturbances persist due to anxiety. - Refill prescriptions for Adderall, trazodone , and diazepam . - Discuss sleep hygiene and strategies to manage racing thoughts.

## 2024-01-13 NOTE — Assessment & Plan Note (Signed)
 Testosterone  deficiency On testosterone  injections for low levels due to testicular dysfunction. Missed doses recently, out of medication. - Order blood work to assess testosterone  levels. - Refill testosterone  prescription.

## 2024-01-13 NOTE — Assessment & Plan Note (Signed)
 Thyroid enlargement causing windpipe obstruction, monitored by endocrinologist. Recent growth noted, no surgery needed yet. - Continue annual ultrasounds with endocrinologist.

## 2024-01-13 NOTE — Assessment & Plan Note (Signed)
 Influenza Severe influenza with persistent cough, sore ribs, fever, and nocturnal wheezing. Risk of pneumonia due to lung scarring. - Consider chest x-ray if symptoms persist post-antibiotics to rule out pneumonia. - Provide sample inhaler to aid breathing and prevent pneumonia. - Consider long-acting inhalers for respiratory symptom management.

## 2024-01-13 NOTE — Assessment & Plan Note (Signed)
 Sleep apnea Difficulty sleeping due to illness and anxiety, with nocturnal wheezing and breathing difficulty. - Encourage continued CPAP use. - Monitor respiratory symptoms and adjust treatment as needed.

## 2024-01-15 ENCOUNTER — Encounter: Payer: Self-pay | Admitting: Physician Assistant

## 2024-01-15 DIAGNOSIS — R251 Tremor, unspecified: Secondary | ICD-10-CM | POA: Insufficient documentation

## 2024-01-15 DIAGNOSIS — R053 Chronic cough: Secondary | ICD-10-CM | POA: Insufficient documentation

## 2024-01-15 DIAGNOSIS — J984 Other disorders of lung: Secondary | ICD-10-CM | POA: Insufficient documentation

## 2024-01-15 NOTE — Assessment & Plan Note (Addendum)
 Chronic hypersensitivity pneumonitis likely exacerbated by pigeon dander and dust exposure. Differential includes bacterial infection. Discussed prednisone  side effects. - Administered steroid injection. - Prescribed oral prednisone  taper. - Ordered chest x-ray at [facility] on [address]. - Prescribed DuoNeb nebulizer for morning use. - Prescribed albuterol  inhaler for use as needed, four hours after DuoNeb. - Prescribed Air Supra inhaler for chronic inflammation management. - Advised to rinse mouth after inhaler use. Orders:   ipratropium-albuterol  (DUONEB) 0.5-2.5 (3) MG/3ML SOLN; Inhale 3 mLs into the lungs every 6 (six) hours as needed.   albuterol  (VENTOLIN  HFA) 108 (90 Base) MCG/ACT inhaler; Inhale 1-2 puffs into the lungs every 6 (six) hours as needed for wheezing or shortness of breath.   Albuterol -Budesonide (AIRSUPRA ) 90-80 MCG/ACT AERO; Inhale 2 puffs into the lungs daily as needed.   triamcinolone  acetonide (KENALOG -40) injection 80 mg   DG Chest 2 View; Future

## 2024-01-15 NOTE — Assessment & Plan Note (Signed)
 Tremor affects fine motor skills. Discussed potential side effects of prednisone  and beta blockers for management. - Discussed potential use of beta blockers for tremor management. - Advised on alcohol use to relax fine motor skills, with caution regarding consumption.

## 2024-01-15 NOTE — Assessment & Plan Note (Addendum)
 Chronic hypersensitivity pneumonitis with chronic cough Chronic hypersensitivity pneumonitis likely exacerbated by pigeon dander and dust exposure. Differential includes bacterial infection. Discussed prednisone  side effects. - Administered steroid injection. - Prescribed oral prednisone  taper. - Ordered chest x-ray at [facility] on [address]. - Prescribed DuoNeb nebulizer for morning use. - Prescribed albuterol  inhaler for use as needed, four hours after DuoNeb. - Prescribed Air Supra inhaler for chronic inflammation management. - Advised to rinse mouth after inhaler use. Orders:   doxycycline  (VIBRA -TABS) 100 MG tablet; Take 1 tablet (100 mg total) by mouth 2 (two) times daily.   predniSONE  (DELTASONE ) 20 MG tablet; Take 3 tablets (60 mg total) by mouth daily with breakfast for 3 days, THEN 2 tablets (40 mg total) daily with breakfast for 3 days, THEN 1 tablet (20 mg total) daily with breakfast for 3 days.   triamcinolone  acetonide (KENALOG -40) injection 80 mg   DG Chest 2 View; Future

## 2024-02-02 ENCOUNTER — Other Ambulatory Visit: Payer: Self-pay | Admitting: Physician Assistant

## 2024-02-02 DIAGNOSIS — F4323 Adjustment disorder with mixed anxiety and depressed mood: Secondary | ICD-10-CM

## 2024-02-04 ENCOUNTER — Other Ambulatory Visit: Payer: Self-pay | Admitting: Physician Assistant

## 2024-02-04 MED ORDER — CARBAMAZEPINE 200 MG PO TABS: 800.0000 mg | ORAL_TABLET | Freq: Every day | ORAL | 0 refills | Status: AC

## 2024-04-05 ENCOUNTER — Ambulatory Visit: Admitting: Physician Assistant
